# Patient Record
Sex: Female | Born: 1955 | Race: Black or African American | Hispanic: No | Marital: Single | Smoking: Former smoker
Health system: Southern US, Community
[De-identification: ages and names within clinical notes are randomized; demographics above are authoritative.]

## PROBLEM LIST (undated history)

## (undated) DIAGNOSIS — M4800 Spinal stenosis, site unspecified: Secondary | ICD-10-CM

## (undated) DIAGNOSIS — T7840XA Allergy, unspecified, initial encounter: Secondary | ICD-10-CM

## (undated) DIAGNOSIS — J449 Chronic obstructive pulmonary disease, unspecified: Secondary | ICD-10-CM

## (undated) DIAGNOSIS — N289 Disorder of kidney and ureter, unspecified: Secondary | ICD-10-CM

## (undated) DIAGNOSIS — N189 Chronic kidney disease, unspecified: Secondary | ICD-10-CM

## (undated) DIAGNOSIS — E785 Hyperlipidemia, unspecified: Secondary | ICD-10-CM

## (undated) DIAGNOSIS — K219 Gastro-esophageal reflux disease without esophagitis: Secondary | ICD-10-CM

## (undated) DIAGNOSIS — E119 Type 2 diabetes mellitus without complications: Secondary | ICD-10-CM

## (undated) DIAGNOSIS — D649 Anemia, unspecified: Secondary | ICD-10-CM

## (undated) DIAGNOSIS — M1990 Unspecified osteoarthritis, unspecified site: Secondary | ICD-10-CM

## (undated) DIAGNOSIS — G4730 Sleep apnea, unspecified: Secondary | ICD-10-CM

## (undated) DIAGNOSIS — I10 Essential (primary) hypertension: Secondary | ICD-10-CM

## (undated) DIAGNOSIS — M25559 Pain in unspecified hip: Secondary | ICD-10-CM

## (undated) HISTORY — DX: Essential (primary) hypertension: I10

## (undated) HISTORY — DX: Allergy, unspecified, initial encounter: T78.40XA

## (undated) HISTORY — PX: CHOLECYSTECTOMY: SHX55

## (undated) HISTORY — PX: ABDOMINAL HYSTERECTOMY: SHX81

## (undated) HISTORY — DX: Chronic obstructive pulmonary disease, unspecified: J44.9

## (undated) HISTORY — DX: Unspecified osteoarthritis, unspecified site: M19.90

## (undated) HISTORY — DX: Type 2 diabetes mellitus without complications: E11.9

## (undated) HISTORY — DX: Pain in unspecified hip: M25.559

## (undated) HISTORY — DX: Gastro-esophageal reflux disease without esophagitis: K21.9

## (undated) HISTORY — DX: Hyperlipidemia, unspecified: E78.5

## (undated) HISTORY — PX: HIP SURGERY: SHX245

---

## 2012-10-22 HISTORY — PX: COLONOSCOPY: SHX174

## 2012-10-22 DEATH — deceased

## 2014-05-10 DIAGNOSIS — Z79899 Other long term (current) drug therapy: Secondary | ICD-10-CM | POA: Diagnosis not present

## 2014-05-10 DIAGNOSIS — M549 Dorsalgia, unspecified: Secondary | ICD-10-CM | POA: Diagnosis not present

## 2014-05-10 DIAGNOSIS — Z5181 Encounter for therapeutic drug level monitoring: Secondary | ICD-10-CM | POA: Diagnosis not present

## 2014-08-06 DIAGNOSIS — Z79899 Other long term (current) drug therapy: Secondary | ICD-10-CM | POA: Diagnosis not present

## 2014-08-06 DIAGNOSIS — Z5181 Encounter for therapeutic drug level monitoring: Secondary | ICD-10-CM | POA: Diagnosis not present

## 2014-08-06 DIAGNOSIS — M549 Dorsalgia, unspecified: Secondary | ICD-10-CM | POA: Diagnosis not present

## 2014-08-06 DIAGNOSIS — F419 Anxiety disorder, unspecified: Secondary | ICD-10-CM | POA: Diagnosis not present

## 2014-08-06 DIAGNOSIS — Z79891 Long term (current) use of opiate analgesic: Secondary | ICD-10-CM | POA: Diagnosis not present

## 2014-10-19 DIAGNOSIS — M19031 Primary osteoarthritis, right wrist: Secondary | ICD-10-CM | POA: Diagnosis not present

## 2014-10-26 DIAGNOSIS — N189 Chronic kidney disease, unspecified: Secondary | ICD-10-CM | POA: Diagnosis not present

## 2014-10-26 DIAGNOSIS — Z0000 Encounter for general adult medical examination without abnormal findings: Secondary | ICD-10-CM | POA: Diagnosis not present

## 2014-10-26 DIAGNOSIS — J069 Acute upper respiratory infection, unspecified: Secondary | ICD-10-CM | POA: Diagnosis not present

## 2014-10-26 DIAGNOSIS — G4700 Insomnia, unspecified: Secondary | ICD-10-CM | POA: Diagnosis not present

## 2014-10-26 DIAGNOSIS — E119 Type 2 diabetes mellitus without complications: Secondary | ICD-10-CM | POA: Diagnosis not present

## 2014-10-26 DIAGNOSIS — L409 Psoriasis, unspecified: Secondary | ICD-10-CM | POA: Diagnosis not present

## 2014-10-26 DIAGNOSIS — M129 Arthropathy, unspecified: Secondary | ICD-10-CM | POA: Diagnosis not present

## 2014-10-26 DIAGNOSIS — F17210 Nicotine dependence, cigarettes, uncomplicated: Secondary | ICD-10-CM | POA: Diagnosis not present

## 2014-12-13 DIAGNOSIS — M545 Low back pain: Secondary | ICD-10-CM | POA: Diagnosis not present

## 2014-12-13 DIAGNOSIS — E119 Type 2 diabetes mellitus without complications: Secondary | ICD-10-CM | POA: Diagnosis not present

## 2014-12-13 DIAGNOSIS — G589 Mononeuropathy, unspecified: Secondary | ICD-10-CM | POA: Diagnosis not present

## 2014-12-13 DIAGNOSIS — I10 Essential (primary) hypertension: Secondary | ICD-10-CM | POA: Diagnosis not present

## 2014-12-13 DIAGNOSIS — M129 Arthropathy, unspecified: Secondary | ICD-10-CM | POA: Diagnosis not present

## 2014-12-13 DIAGNOSIS — F419 Anxiety disorder, unspecified: Secondary | ICD-10-CM | POA: Diagnosis not present

## 2015-01-11 DIAGNOSIS — E119 Type 2 diabetes mellitus without complications: Secondary | ICD-10-CM | POA: Diagnosis not present

## 2015-01-11 DIAGNOSIS — M129 Arthropathy, unspecified: Secondary | ICD-10-CM | POA: Diagnosis not present

## 2015-01-11 DIAGNOSIS — I10 Essential (primary) hypertension: Secondary | ICD-10-CM | POA: Diagnosis not present

## 2015-01-11 DIAGNOSIS — N189 Chronic kidney disease, unspecified: Secondary | ICD-10-CM | POA: Diagnosis not present

## 2015-01-11 DIAGNOSIS — L409 Psoriasis, unspecified: Secondary | ICD-10-CM | POA: Diagnosis not present

## 2015-01-11 DIAGNOSIS — M545 Low back pain: Secondary | ICD-10-CM | POA: Diagnosis not present

## 2015-03-08 DIAGNOSIS — Z720 Tobacco use: Secondary | ICD-10-CM | POA: Diagnosis not present

## 2015-03-08 DIAGNOSIS — J069 Acute upper respiratory infection, unspecified: Secondary | ICD-10-CM | POA: Diagnosis not present

## 2015-03-08 DIAGNOSIS — K219 Gastro-esophageal reflux disease without esophagitis: Secondary | ICD-10-CM | POA: Diagnosis not present

## 2015-03-08 DIAGNOSIS — E108 Type 1 diabetes mellitus with unspecified complications: Secondary | ICD-10-CM | POA: Diagnosis not present

## 2015-03-08 DIAGNOSIS — I10 Essential (primary) hypertension: Secondary | ICD-10-CM | POA: Diagnosis not present

## 2015-03-08 DIAGNOSIS — L408 Other psoriasis: Secondary | ICD-10-CM | POA: Diagnosis not present

## 2015-03-08 DIAGNOSIS — E785 Hyperlipidemia, unspecified: Secondary | ICD-10-CM | POA: Diagnosis not present

## 2015-03-08 DIAGNOSIS — G629 Polyneuropathy, unspecified: Secondary | ICD-10-CM | POA: Diagnosis not present

## 2015-06-28 DIAGNOSIS — D649 Anemia, unspecified: Secondary | ICD-10-CM | POA: Diagnosis not present

## 2015-06-28 DIAGNOSIS — Z0000 Encounter for general adult medical examination without abnormal findings: Secondary | ICD-10-CM | POA: Diagnosis not present

## 2015-06-28 DIAGNOSIS — E119 Type 2 diabetes mellitus without complications: Secondary | ICD-10-CM | POA: Diagnosis not present

## 2015-09-28 ENCOUNTER — Ambulatory Visit (INDEPENDENT_AMBULATORY_CARE_PROVIDER_SITE_OTHER): Payer: Medicare (Managed Care) | Admitting: Family Medicine

## 2015-09-28 ENCOUNTER — Other Ambulatory Visit: Payer: Self-pay | Admitting: Family Medicine

## 2015-09-28 ENCOUNTER — Encounter: Payer: Self-pay | Admitting: Family Medicine

## 2015-09-28 VITALS — BP 120/80 | HR 76 | Ht 63.0 in | Wt 220.0 lb

## 2015-09-28 DIAGNOSIS — G4733 Obstructive sleep apnea (adult) (pediatric): Secondary | ICD-10-CM | POA: Insufficient documentation

## 2015-09-28 DIAGNOSIS — Z96642 Presence of left artificial hip joint: Secondary | ICD-10-CM

## 2015-09-28 DIAGNOSIS — L4050 Arthropathic psoriasis, unspecified: Secondary | ICD-10-CM

## 2015-09-28 DIAGNOSIS — E1165 Type 2 diabetes mellitus with hyperglycemia: Secondary | ICD-10-CM | POA: Diagnosis not present

## 2015-09-28 DIAGNOSIS — D509 Iron deficiency anemia, unspecified: Secondary | ICD-10-CM

## 2015-09-28 DIAGNOSIS — J40 Bronchitis, not specified as acute or chronic: Secondary | ICD-10-CM

## 2015-09-28 DIAGNOSIS — Z23 Encounter for immunization: Secondary | ICD-10-CM

## 2015-09-28 DIAGNOSIS — Z87448 Personal history of other diseases of urinary system: Secondary | ICD-10-CM

## 2015-09-28 DIAGNOSIS — E669 Obesity, unspecified: Secondary | ICD-10-CM

## 2015-09-28 DIAGNOSIS — E1140 Type 2 diabetes mellitus with diabetic neuropathy, unspecified: Secondary | ICD-10-CM | POA: Diagnosis not present

## 2015-09-28 DIAGNOSIS — K2100 Gastro-esophageal reflux disease with esophagitis, without bleeding: Secondary | ICD-10-CM

## 2015-09-28 DIAGNOSIS — E785 Hyperlipidemia, unspecified: Secondary | ICD-10-CM | POA: Diagnosis not present

## 2015-09-28 DIAGNOSIS — F17200 Nicotine dependence, unspecified, uncomplicated: Secondary | ICD-10-CM | POA: Insufficient documentation

## 2015-09-28 DIAGNOSIS — E113299 Type 2 diabetes mellitus with mild nonproliferative diabetic retinopathy without macular edema, unspecified eye: Secondary | ICD-10-CM

## 2015-09-28 DIAGNOSIS — I10 Essential (primary) hypertension: Secondary | ICD-10-CM | POA: Diagnosis not present

## 2015-09-28 DIAGNOSIS — M549 Dorsalgia, unspecified: Secondary | ICD-10-CM | POA: Diagnosis not present

## 2015-09-28 DIAGNOSIS — E559 Vitamin D deficiency, unspecified: Secondary | ICD-10-CM

## 2015-09-28 DIAGNOSIS — Z720 Tobacco use: Secondary | ICD-10-CM | POA: Diagnosis not present

## 2015-09-28 DIAGNOSIS — IMO0002 Reserved for concepts with insufficient information to code with codable children: Secondary | ICD-10-CM

## 2015-09-28 DIAGNOSIS — G8929 Other chronic pain: Secondary | ICD-10-CM

## 2015-09-28 DIAGNOSIS — N183 Chronic kidney disease, stage 3 unspecified: Secondary | ICD-10-CM

## 2015-09-28 DIAGNOSIS — J309 Allergic rhinitis, unspecified: Secondary | ICD-10-CM | POA: Diagnosis not present

## 2015-09-28 DIAGNOSIS — K219 Gastro-esophageal reflux disease without esophagitis: Secondary | ICD-10-CM | POA: Insufficient documentation

## 2015-09-28 DIAGNOSIS — R197 Diarrhea, unspecified: Secondary | ICD-10-CM

## 2015-09-28 DIAGNOSIS — M4800 Spinal stenosis, site unspecified: Secondary | ICD-10-CM | POA: Insufficient documentation

## 2015-09-28 MED ORDER — AZITHROMYCIN 250 MG PO TABS: ORAL_TABLET | ORAL | Status: DC

## 2015-09-28 NOTE — Progress Notes (Signed)
Date:  09/28/2015   Name:  Kathleen Lee   DOB:  21-Oct-1956   MRN:  [id number]  PCP:  No primary care provider on file.    Chief Complaint: Establish Care   History of Present Illness:  This is a 59 y.o. female with MMP recently relocated here from [place]. Has psoriatic arthritis on [place] for past year, helping. 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 AM BG's aroung 130. Hx HLD well controlled on Lipitor. Hx GERD with esophagitis on LT Nexium. Hx AR well controlled on Veramyst. Takes Lasix daily for HTN and LE edema. Hx ARF on dialysis in past but says kidney function now normal. S/p L THR 02-02-99. Hx spinal stenosis, has seen NSGY and ortho in past but pain getting worse, wants to see NSGY again. Hx chronic back pain, tramadol doesn't help much. Tetanus imm, mammogram, and colonoscopy last year, needs flu imm. C/o frequent diarrhea with flatus and intermittent blood in stool. Hx OSA on CPAP, machine broke during move. C/o 1 wk hx NP cough, sinus congestion, rhinorrhea. Smoker. Mother died with Alz dementia February 01, 2010.  Review of Systems:  Review of Systems  Constitutional: Negative for fever and chills.  HENT: Negative for ear pain, sore throat and trouble swallowing.   Eyes: Negative for pain.  Respiratory: Negative for shortness of breath.   Cardiovascular: Negative for chest pain.  Endocrine: Negative for polyuria.  Genitourinary: Negative for difficulty urinating.  Neurological: Negative for syncope and light-headedness.    Patient Active Problem List   Diagnosis Date Noted  . Hypertension 09/28/2015  . Type 2 diabetes, uncontrolled, with neuropathy ([place]) 09/28/2015  . Hx of acute renal failure 09/28/2015  . Hyperlipidemia 09/28/2015  . Psoriatic arthritis ([place]) 09/28/2015  . Obesity, Class II, BMI 35-39.9 09/28/2015  . Spinal stenosis 09/28/2015  . GERD (gastroesophageal reflux disease) 09/28/2015  . Allergic rhinitis  09/28/2015  . Chronic back pain 09/28/2015  . OSA (obstructive sleep apnea) 09/28/2015  . Smoker 09/28/2015  . History of left hip replacement 09/28/2015    Prior to Admission medications   Medication Sig Start Date End Date Taking? Authorizing Provider  Apremilast (OTEZLA) 30 MG TABS Take 1 tablet by mouth 2 (two) times daily.   Yes Historical Provider, MD  atorvastatin (LIPITOR) 40 MG tablet Take 40 mg by mouth every evening. for cholesterol 09/21/15  Yes Historical Provider, MD  azithromycin (ZITHROMAX) 250 MG tablet Take two tablets today then one tablet daily for four days 09/28/15   [name], MD  esomeprazole (NEXIUM) 20 MG capsule Take 20 mg by mouth daily at 12 noon.   Yes Historical Provider, MD  fluticasone (VERAMYST) 27.5 MCG/SPRAY nasal spray Place 2 sprays into the nose daily.   Yes Historical Provider, MD  furosemide (LASIX) 40 MG tablet Take 40 mg by mouth daily.   Yes Historical Provider, MD  insulin aspart (NOVOLOG FLEXPEN) 100 UNIT/ML FlexPen Inject 60 Units into the skin 3 (three) times daily with meals.   Yes Historical Provider, MD  insulin glargine (LANTUS) 100 UNIT/ML injection Inject 25 Units into the skin at bedtime.   Yes Historical Provider, MD  leflunomide (ARAVA) 10 MG tablet Take 10 mg by mouth daily.   Yes Historical Provider, MD  lisinopril (PRINIVIL,ZESTRIL) 20 MG tablet Take 20 mg by mouth daily.   Yes Historical Provider, MD  Potassium Chloride ER 20 MEQ TBCR Take 1 tablet by mouth daily.   Yes  Historical Provider, MD  pregabalin (LYRICA) 100 MG capsule Take 100 mg by mouth 3 (three) times daily.   Yes Historical Provider, MD  traMADol (ULTRAM) 50 MG tablet Take 50 mg by mouth daily.   Yes Historical Provider, MD    No Known Allergies  Past Surgical History  Procedure Laterality Date  . Hip surgery Left   . Cholecystectomy    . Colonoscopy  2014    inconclusive    Social History  Substance Use Topics  . Smoking status: Current Every Day  Smoker  . Smokeless tobacco: None  . Alcohol Use: No    History reviewed. No pertinent family history.  Medication list has been reviewed and updated.  Physical Examination: BP 120/80 mmHg  Pulse 76  Ht 5\' 3"  (1.6 m)  Wt 220 lb (99.791 kg)  BMI 38.98 kg/m2  Physical Exam  Constitutional: She is oriented to person, place, and time. She appears well-developed and well-nourished.  HENT:  Head: Normocephalic and atraumatic.  Right Ear: External ear normal.  Left Ear: External ear normal.  Nose: Nose normal.  Mouth/Throat: Oropharynx is clear and moist.  TM's clear  Neck: Neck supple. No thyromegaly present.  Cardiovascular: Normal rate, regular rhythm and normal heart sounds.   Pulmonary/Chest: Effort normal and breath sounds normal.  Abdominal: Soft. She exhibits no distension and no mass. There is no tenderness.  Musculoskeletal: She exhibits no edema.  Lymphadenopathy:    She has no cervical adenopathy.  Neurological: She is alert and oriented to person, place, and time. Coordination normal.  Romberg negative but gait wide-based  Skin: Skin is warm and dry.  Psychiatric: She has a normal mood and affect. Her behavior is normal.  Nursing note and vitals reviewed.   Assessment and Plan:  1. Psoriatic arthritis ([place]) Well controlled on [place] - Ambulatory referral to Dermatology - Ambulatory referral to Rheumatology  2. Essential hypertension Well controlled on lisinopril/Lasix - Comprehensive Metabolic Panel (CMET) - CBC  3. Type 2 diabetes, uncontrolled, with neuropathy (HCC) Unclear control on Lantus/Novolog/Lyrica - HgB A1c - B12 - Urine Microalbumin w/creat. ratio - Ambulatory referral to Ophthalmology - Ambulatory referral to Podiatry  4. Hyperlipidemia Well controlled on Lipitor - Lipid Profile  5. Obesity, Class II, BMI 35-39.9 Discussed exercise/weight loss - TSH - Vitamin D (25 hydroxy)  6. Bronchitis Persistent, Zpak x 5d  7.  Spinal stenosis, unspecified spinal region Pain progressive, requests referral back to [place] - Ambulatory referral to Neurosurgery  8. Gastroesophageal reflux disease with esophagitis Well controlled on long-term Nexium  9. Allergic rhinitis, unspecified allergic rhinitis type Well controlled on Veramyst  10. Chronic back pain Marginal control on tramadol  11. OSA (obstructive sleep apnea) On CPAP, needs new machine - Ambulatory referral to Sleep Studies  12. Smoker Not interested in cessation  13. Hx of acute renal failure Check renal function today  14. Need for influenza vaccination Consider pneumovax imm next visit - Flu Vaccine QUAD 36+ mos PF IM (Fluarix & Fluzone Quad PF)  15. History of left hip replacement  16. Diarrhea, unspecified type With intermittent hematochezia, refer GI - Ambulatory referral to Gastroenterology  No Follow-up on file.  Satira Anis. [facility]  09/28/2015

## 2015-09-29 DIAGNOSIS — N183 Chronic kidney disease, stage 3 (moderate): Secondary | ICD-10-CM

## 2015-09-29 DIAGNOSIS — E559 Vitamin D deficiency, unspecified: Secondary | ICD-10-CM | POA: Insufficient documentation

## 2015-09-29 DIAGNOSIS — D631 Anemia in chronic kidney disease: Secondary | ICD-10-CM | POA: Insufficient documentation

## 2015-09-29 DIAGNOSIS — D649 Anemia, unspecified: Secondary | ICD-10-CM | POA: Insufficient documentation

## 2015-09-29 LAB — COMPREHENSIVE METABOLIC PANEL
A/G RATIO: 1.3 (ref 1.1–2.5)
ALBUMIN: 3.8 g/dL (ref 3.5–5.5)
ALT: 11 IU/L (ref 0–32)
AST: 14 IU/L (ref 0–40)
Alkaline Phosphatase: 113 IU/L (ref 39–117)
BUN / CREAT RATIO: 12 (ref 9–23)
BUN: 15 mg/dL (ref 6–24)
CHLORIDE: 100 mmol/L (ref 97–106)
CO2: 27 mmol/L (ref 18–29)
Calcium: 8.3 mg/dL — ABNORMAL LOW (ref 8.7–10.2)
Creatinine, Ser: 1.27 mg/dL — ABNORMAL HIGH (ref 0.57–1.00)
GFR calc non Af Amer: 47 mL/min/{1.73_m2} — ABNORMAL LOW (ref >59)
GFR, EST AFRICAN AMERICAN: 54 mL/min/{1.73_m2} — AB (ref >59)
Globulin, Total: 2.9 g/dL (ref 1.5–4.5)
Glucose: 101 mg/dL — ABNORMAL HIGH (ref 65–99)
POTASSIUM: 4.7 mmol/L (ref 3.5–5.2)
Sodium: 139 mmol/L (ref 136–144)
TOTAL PROTEIN: 6.7 g/dL (ref 6.0–8.5)

## 2015-09-29 LAB — CBC
Hematocrit: 26.1 % — ABNORMAL LOW (ref 34.0–46.6)
Hemoglobin: 7.5 g/dL — ABNORMAL LOW (ref 11.1–15.9)
MCH: 20.5 pg — ABNORMAL LOW (ref 26.6–33.0)
MCHC: 28.7 g/dL — AB (ref 31.5–35.7)
MCV: 72 fL — ABNORMAL LOW (ref 79–97)
PLATELETS: 381 10*3/uL — AB (ref 150–379)
RBC: 3.65 x10E6/uL — ABNORMAL LOW (ref 3.77–5.28)
RDW: 18.4 % — AB (ref 12.3–15.4)
WBC: 10 10*3/uL (ref 3.4–10.8)

## 2015-09-29 LAB — LIPID PANEL
CHOL/HDL RATIO: 2.7 ratio (ref 0.0–4.4)
Cholesterol, Total: 138 mg/dL (ref 100–199)
HDL: 52 mg/dL (ref >39)
LDL Calculated: 72 mg/dL (ref 0–99)
Triglycerides: 71 mg/dL (ref 0–149)
VLDL Cholesterol Cal: 14 mg/dL (ref 5–40)

## 2015-09-29 LAB — HEMOGLOBIN A1C
Est. average glucose Bld gHb Est-mCnc: 189 mg/dL
HEMOGLOBIN A1C: 8.2 % — AB (ref 4.8–5.6)

## 2015-09-29 LAB — MICROALBUMIN / CREATININE URINE RATIO
Creatinine, Urine: 44.8 mg/dL
MICROALB/CREAT RATIO: 797.1 mg/g{creat} — AB (ref 0.0–30.0)
Microalbumin, Urine: 357.1 ug/mL

## 2015-09-29 LAB — VITAMIN D 25 HYDROXY (VIT D DEFICIENCY, FRACTURES): VIT D 25 HYDROXY: 14.1 ng/mL — AB (ref 30.0–100.0)

## 2015-09-29 LAB — TSH: TSH: 1.21 u[IU]/mL (ref 0.450–4.500)

## 2015-09-29 LAB — VITAMIN B12: VITAMIN B 12: 616 pg/mL (ref 211–946)

## 2015-09-29 MED ORDER — METFORMIN HCL 500 MG PO TABS: 500.0000 mg | ORAL_TABLET | Freq: Two times a day (BID) | ORAL | Status: DC

## 2015-09-29 NOTE — Addendum Note (Signed)
Addended by: [name] on: 09/29/2015 10:22 AM   Modules accepted: Orders

## 2015-10-13 ENCOUNTER — Other Ambulatory Visit: Payer: Self-pay | Admitting: Family Medicine

## 2015-10-14 ENCOUNTER — Telehealth: Payer: Self-pay

## 2015-10-14 MED ORDER — LISINOPRIL 20 MG PO TABS: 20.0000 mg | ORAL_TABLET | Freq: Every day | ORAL | Status: DC

## 2015-10-14 NOTE — Addendum Note (Signed)
Addended by: [name] on: 10/14/2015 12:19 PM   Modules accepted: Orders

## 2015-10-14 NOTE — Telephone Encounter (Signed)
Sent to [place] 

## 2015-10-18 ENCOUNTER — Other Ambulatory Visit: Payer: Self-pay | Admitting: Family Medicine

## 2015-10-18 ENCOUNTER — Telehealth: Payer: Self-pay

## 2015-10-18 MED ORDER — LISINOPRIL 40 MG PO TABS: 40.0000 mg | ORAL_TABLET | Freq: Every day | ORAL | Status: DC

## 2015-10-18 NOTE — Telephone Encounter (Signed)
Sent to [place] 

## 2015-10-31 ENCOUNTER — Ambulatory Visit: Payer: Medicare (Managed Care) | Admitting: Family Medicine

## 2015-11-01 ENCOUNTER — Ambulatory Visit: Payer: Medicare (Managed Care) | Admitting: Family Medicine

## 2015-11-02 ENCOUNTER — Other Ambulatory Visit: Payer: Self-pay

## 2015-11-03 ENCOUNTER — Encounter (INDEPENDENT_AMBULATORY_CARE_PROVIDER_SITE_OTHER): Payer: Self-pay

## 2015-11-03 ENCOUNTER — Encounter: Payer: Self-pay | Admitting: Gastroenterology

## 2015-11-03 ENCOUNTER — Other Ambulatory Visit: Payer: Self-pay

## 2015-11-03 ENCOUNTER — Ambulatory Visit (INDEPENDENT_AMBULATORY_CARE_PROVIDER_SITE_OTHER): Payer: Medicare Other | Admitting: Gastroenterology

## 2015-11-03 ENCOUNTER — Ambulatory Visit: Payer: Medicare (Managed Care) | Admitting: Gastroenterology

## 2015-11-03 VITALS — BP 129/65 | HR 94 | Temp 99.3°F | Ht 63.0 in | Wt 220.0 lb

## 2015-11-03 DIAGNOSIS — R197 Diarrhea, unspecified: Secondary | ICD-10-CM

## 2015-11-03 DIAGNOSIS — D509 Iron deficiency anemia, unspecified: Secondary | ICD-10-CM | POA: Diagnosis not present

## 2015-11-03 NOTE — Progress Notes (Signed)
Gastroenterology Consultation  Referring Provider:     No ref. provider found Primary Care Physician:  [name], MD Primary Gastroenterologist:  Dr. Allen Norris     Reason for Consultation:     Diarrhea        HPI:   Kathleen Lee is a 60 y.o. y/o female referred for consultation & management of arrhythmia by Dr. [name], MD.  This patient comes in today with a history of having a colonoscopy 2 years ago that was a poor prep. The patient now has diarrhea that started shortly after having the colonoscopy but she does not think is related. The patient has 5 bowel movements of watery diarrhea per day. She has no weight loss fevers chills nausea vomiting. The patient also states that she has woken up in the middle of night from her diarrhea. The patient denies any unexplained weight loss. The patient recently had blood work that showed her hemoglobin to be 7.5. The patient also was found to have an MCV that was low at 72. She had B12 that was normal. The patient has also had chronic renal insufficiency.  Past Medical History  Diagnosis Date  . Diabetes mellitus without complication ([place])   . Hyperlipidemia   . Hypertension   . Hip pain   . Arthritis   . COPD (chronic obstructive pulmonary disease) ([place])   . GERD (gastroesophageal reflux disease)     Past Surgical History  Procedure Laterality Date  . Hip surgery Left   . Cholecystectomy    . Colonoscopy  2014    inconclusive  . Abdominal hysterectomy      Prior to Admission medications   Medication Sig Start Date End Date Taking? Authorizing Provider  Apremilast (OTEZLA) 30 MG TABS Take 1 tablet by mouth 2 (two) times daily.   Yes Historical Provider, MD  atorvastatin (LIPITOR) 40 MG tablet Take 40 mg by mouth every evening. for cholesterol 09/21/15  Yes Historical Provider, MD  esomeprazole (NEXIUM) 20 MG capsule Take 20 mg by mouth daily at 12 noon.   Yes Historical Provider, MD  fluticasone (VERAMYST) 27.5 MCG/SPRAY nasal  spray Place 2 sprays into the nose daily.   Yes Historical Provider, MD  furosemide (LASIX) 40 MG tablet Take 40 mg by mouth daily.   Yes Historical Provider, MD  insulin aspart (NOVOLOG FLEXPEN) 100 UNIT/ML FlexPen Inject 60 Units into the skin 3 (three) times daily with meals.   Yes Historical Provider, MD  insulin glargine (LANTUS) 100 UNIT/ML injection Inject 25 Units into the skin at bedtime.   Yes Historical Provider, MD  lisinopril (PRINIVIL,ZESTRIL) 40 MG tablet Take 1 tablet (40 mg total) by mouth daily. 10/18/15  Yes [name], MD  metFORMIN (GLUCOPHAGE) 500 MG tablet Take 1 tablet (500 mg total) by mouth 2 (two) times daily with a meal. 09/29/15  Yes [name], MD  Potassium Chloride ER 20 MEQ TBCR Take 1 tablet by mouth daily.   Yes Historical Provider, MD  pregabalin (LYRICA) 100 MG capsule Take 100 mg by mouth 3 (three) times daily.   Yes Historical Provider, MD  Vitamin D, Ergocalciferol, (DRISDOL) 50000 units CAPS capsule Take 50,000 Units by mouth once a week. 10/20/15  Yes Historical Provider, MD  azithromycin (ZITHROMAX) 250 MG tablet Take two tablets today then one tablet daily for four days Patient not taking: Reported on 11/03/2015 09/28/15   [name], MD  leflunomide (ARAVA) 10 MG tablet Take 10 mg by mouth daily. Reported on 11/03/2015  Historical Provider, MD  traMADol (ULTRAM) 50 MG tablet Take 50 mg by mouth daily. Reported on 11/03/2015    Historical Provider, MD    Family History  Problem Relation Age of Onset  . Diabetes Father      Social History  Substance Use Topics  . Smoking status: Current Every Day Smoker  . Smokeless tobacco: Never Used  . Alcohol Use: No    Allergies as of 11/03/2015  . (No Known Allergies)    Review of Systems:    All systems reviewed and negative except where noted in HPI.   Physical Exam:  BP 129/65 mmHg  Pulse 94  Temp(Src) 99.3 F (37.4 C) (Oral)  Ht 5\' 3"  (1.6 m)  Wt 220 lb (99.791 kg)  BMI 38.98  kg/m2 No LMP recorded. Patient has had a hysterectomy. Psych:  Alert and cooperative. Normal mood and affect. General:   Alert,  Well-developed, obese well-nourished, pleasant and cooperative in NAD Head:  Normocephalic and atraumatic. Eyes:  Sclera clear, no icterus.   Conjunctiva pink. Ears:  Normal auditory acuity. Nose:  No deformity, discharge, or lesions. Mouth:  No deformity or lesions,oropharynx pink & moist. Neck:  Supple; no masses or thyromegaly. Lungs:  Respirations even and unlabored.  Clear throughout to auscultation.   No wheezes, crackles, or rhonchi. No acute distress. Heart:  Regular rate and rhythm; no murmurs, clicks, rubs, or gallops. Abdomen:  Normal bowel sounds.  No bruits.  Soft, non-tender and non-distended without masses, hepatosplenomegaly or hernias noted.  No guarding or rebound tenderness.  Negative Carnett sign.   Rectal:  Deferred.  Msk:  Symmetrical without gross deformities.  Good, abnormal gait walking with a cane. Pulses:  Normal pulses noted. Extremities:  No clubbing or edema.  No cyanosis. Neurologic:  Alert and oriented x3;  grossly normal neurologically. Skin:  Intact without significant lesions or rashes.  No jaundice. Lymph Nodes:  No significant cervical adenopathy. Psych:  Alert and cooperative. Normal mood and affect.  Imaging Studies: No results found.  Assessment and Plan:   Kathleen Lee is a 60 y.o. y/o female who comes in today with a history of anemia and diarrhea. The patient has had diarrhea since she moved here from [place]. The patient will be set up for an EGD and colonoscopy due to her diarrhea and anemia. The patient has been explained the plan and agrees with it. I have discussed risks & benefits which include, but are not limited to, bleeding, infection, perforation & drug reaction.  The patient agrees with this plan & written consent will be obtained.      Note: This dictation was prepared with Dragon dictation along with  smaller phrase technology. Any transcriptional errors that result from this process are unintentional.

## 2015-11-11 DIAGNOSIS — E113293 Type 2 diabetes mellitus with mild nonproliferative diabetic retinopathy without macular edema, bilateral: Secondary | ICD-10-CM | POA: Diagnosis not present

## 2015-11-14 DIAGNOSIS — E113299 Type 2 diabetes mellitus with mild nonproliferative diabetic retinopathy without macular edema, unspecified eye: Secondary | ICD-10-CM | POA: Insufficient documentation

## 2015-11-15 DIAGNOSIS — L851 Acquired keratosis [keratoderma] palmaris et plantaris: Secondary | ICD-10-CM | POA: Diagnosis not present

## 2015-11-15 DIAGNOSIS — Z794 Long term (current) use of insulin: Secondary | ICD-10-CM | POA: Diagnosis not present

## 2015-11-15 DIAGNOSIS — B351 Tinea unguium: Secondary | ICD-10-CM | POA: Diagnosis not present

## 2015-11-15 DIAGNOSIS — E1140 Type 2 diabetes mellitus with diabetic neuropathy, unspecified: Secondary | ICD-10-CM | POA: Diagnosis not present

## 2015-11-17 ENCOUNTER — Ambulatory Visit (INDEPENDENT_AMBULATORY_CARE_PROVIDER_SITE_OTHER): Payer: Medicare Other | Admitting: Family Medicine

## 2015-11-17 ENCOUNTER — Encounter: Payer: Self-pay | Admitting: Family Medicine

## 2015-11-17 VITALS — BP 127/78 | HR 98 | Temp 98.4°F | Resp 15 | Ht 63.0 in | Wt 224.4 lb

## 2015-11-17 DIAGNOSIS — I10 Essential (primary) hypertension: Secondary | ICD-10-CM | POA: Diagnosis not present

## 2015-11-17 DIAGNOSIS — E669 Obesity, unspecified: Secondary | ICD-10-CM

## 2015-11-17 DIAGNOSIS — Z23 Encounter for immunization: Secondary | ICD-10-CM | POA: Diagnosis not present

## 2015-11-17 DIAGNOSIS — M4800 Spinal stenosis, site unspecified: Secondary | ICD-10-CM

## 2015-11-17 DIAGNOSIS — E1140 Type 2 diabetes mellitus with diabetic neuropathy, unspecified: Secondary | ICD-10-CM

## 2015-11-17 DIAGNOSIS — E1165 Type 2 diabetes mellitus with hyperglycemia: Secondary | ICD-10-CM | POA: Diagnosis not present

## 2015-11-17 DIAGNOSIS — E559 Vitamin D deficiency, unspecified: Secondary | ICD-10-CM

## 2015-11-17 DIAGNOSIS — M25552 Pain in left hip: Secondary | ICD-10-CM

## 2015-11-17 DIAGNOSIS — N183 Chronic kidney disease, stage 3 unspecified: Secondary | ICD-10-CM

## 2015-11-17 DIAGNOSIS — D509 Iron deficiency anemia, unspecified: Secondary | ICD-10-CM | POA: Diagnosis not present

## 2015-11-17 DIAGNOSIS — E785 Hyperlipidemia, unspecified: Secondary | ICD-10-CM

## 2015-11-17 DIAGNOSIS — IMO0002 Reserved for concepts with insufficient information to code with codable children: Secondary | ICD-10-CM

## 2015-11-17 MED ORDER — TRAMADOL HCL 50 MG PO TABS: 50.0000 mg | ORAL_TABLET | Freq: Four times a day (QID) | ORAL | Status: DC | PRN

## 2015-11-17 MED ORDER — IRON 325 (65 FE) MG PO TABS: 1.0000 | ORAL_TABLET | Freq: Every day | ORAL | Status: DC

## 2015-11-17 NOTE — Patient Instructions (Signed)
Take OTC iron sulfate 325 mg daily.

## 2015-11-17 NOTE — Progress Notes (Signed)
Date:  11/17/2015   Name:  Kathleen Lee   DOB:  1956/01/30   MRN:  [id number]  PCP:  [name], MD    Chief Complaint: Hypertension and Knee Pain   History of Present Illness:  This is a 60 y.o. female with MMP for 1 month follow up from initial visit. Lisinopril increased due to elevated MCR and K+ supplement stopped. Started on metformin in addition to usual insulin. HLD well controlled on Lipitor. Resp sxs resolved on Zpak. Spinal stenosis still causing chronic back pain. Saw GI for blood in stool, for colonoscopy next week. Blood work showed probable iron def anemia, has not started iron yet because thinks too expensive. C/o increasing L hip pain, s/p THR in 2000. On weekly vit D replacement. Blood work showed CKD3. Occ BLE edema at night.  Review of Systems:  Review of Systems  Respiratory: Negative for shortness of breath.   Cardiovascular: Negative for chest pain.  Endocrine: Negative for polyuria.  Genitourinary: Negative for difficulty urinating.  Neurological: Negative for syncope, weakness and light-headedness.    Patient Active Problem List   Diagnosis Date Noted  . Diabetic retinopathy, background ([place]) 11/14/2015  . CKD (chronic kidney disease) stage 3, GFR 30-59 ml/min 09/29/2015  . Vitamin D deficiency 09/29/2015  . Anemia 09/29/2015  . Hypertension 09/28/2015  . Type 2 diabetes, uncontrolled, with neuropathy ([place]) 09/28/2015  . Hx of acute renal failure 09/28/2015  . Hyperlipidemia 09/28/2015  . Psoriatic arthritis ([place]) 09/28/2015  . Obesity, Class II, BMI 35-39.9 09/28/2015  . Spinal stenosis 09/28/2015  . GERD (gastroesophageal reflux disease) 09/28/2015  . Allergic rhinitis 09/28/2015  . Chronic back pain 09/28/2015  . OSA (obstructive sleep apnea) 09/28/2015  . Smoker 09/28/2015  . History of left hip replacement 09/28/2015    Prior to Admission medications   Medication Sig Start Date End Date Taking? Authorizing Provider  Apremilast (OTEZLA) 30  MG TABS Take 1 tablet by mouth 2 (two) times daily.   Yes Historical Provider, MD  atorvastatin (LIPITOR) 40 MG tablet Take 40 mg by mouth every evening. for cholesterol 09/21/15  Yes Historical Provider, MD  esomeprazole (NEXIUM) 20 MG capsule Take 20 mg by mouth daily at 12 noon.   Yes Historical Provider, MD  fluticasone (VERAMYST) 27.5 MCG/SPRAY nasal spray Place 2 sprays into the nose daily.   Yes Historical Provider, MD  furosemide (LASIX) 40 MG tablet Take 40 mg by mouth daily.   Yes Historical Provider, MD  insulin aspart (NOVOLOG FLEXPEN) 100 UNIT/ML FlexPen Inject 60 Units into the skin 3 (three) times daily with meals.   Yes Historical Provider, MD  insulin glargine (LANTUS) 100 UNIT/ML injection Inject 25 Units into the skin at bedtime.   Yes Historical Provider, MD  leflunomide (ARAVA) 10 MG tablet Take 10 mg by mouth daily. Reported on 11/03/2015   Yes Historical Provider, MD  lisinopril (PRINIVIL,ZESTRIL) 40 MG tablet Take 1 tablet (40 mg total) by mouth daily. 10/18/15  Yes [name], MD  metFORMIN (GLUCOPHAGE) 500 MG tablet Take 1 tablet (500 mg total) by mouth 2 (two) times daily with a meal. 09/29/15  Yes [name], MD  pregabalin (LYRICA) 100 MG capsule Take 100 mg by mouth 3 (three) times daily.   Yes Historical Provider, MD  Vitamin D, Ergocalciferol, (DRISDOL) 50000 units CAPS capsule Take 50,000 Units by mouth once a week. 10/20/15  Yes Historical Provider, MD  Ferrous Sulfate (IRON) 325 (65 Fe) MG TABS Take 1 tablet by mouth daily.  11/17/15   [name], MD  traMADol (ULTRAM) 50 MG tablet Take 1 tablet (50 mg total) by mouth every 6 (six) hours as needed. Reported on 11/17/2015 11/17/15   [name], MD    No Known Allergies  Past Surgical History  Procedure Laterality Date  . Hip surgery Left   . Cholecystectomy    . Colonoscopy  2014    inconclusive  . Abdominal hysterectomy      Social History  Substance Use Topics  . Smoking status: Former Smoker     Quit date: 10/23/2015  . Smokeless tobacco: Never Used  . Alcohol Use: No    Family History  Problem Relation Age of Onset  . Diabetes Father     Medication list has been reviewed and updated.  Physical Examination: BP 127/78 mmHg  Pulse 98  Temp(Src) 98.4 F (36.9 C)  Resp 15  Ht 5\' 3"  (1.6 m)  Wt 224 lb 6.4 oz (101.787 kg)  BMI 39.76 kg/m2  Physical Exam  Constitutional: She appears well-developed and well-nourished.  Cardiovascular: Normal rate, regular rhythm and normal heart sounds.   Pulmonary/Chest: Effort normal and breath sounds normal.  Musculoskeletal: She exhibits no edema.  Neurological: She is alert.  Skin: Skin is warm and dry.  Psychiatric: She has a normal mood and affect. Her behavior is normal.  Nursing note and vitals reviewed.   Assessment and Plan:  1. Type 2 diabetes, uncontrolled, with neuropathy (HCC) Marginal control, metformin added to usual insulin last month - HgB A1c  2. Essential hypertension Well controlled on current regimen  3. CKD (chronic kidney disease) stage 3, GFR 30-59 ml/min Check labs on increased lisinopril - Basic Metabolic Panel (BMET)  4. Hyperlipidemia Well controlled on Lipitor  5. Obesity, Class II, BMI 35-39.9  6. Spinal stenosis, unspecified spinal region  7. Vitamin D deficiency On weekly Drisdol past month - Vitamin D (25 hydroxy)  8. Iron deficiency anemia Start OTC Fe sulfate 325 mg daily, for colonoscopy next week - CBC  9. Left hip pain S/p THR 2000, tramadol prn pain - Ambulatory referral to Orthopedic Surgery  10. Need for pneumococcal vaccination - Pneumococcal polysaccharide vaccine 23-valent greater than or equal to 2yo subcutaneous/IM  Return in about 3 months (around 02/15/2016).  Satira Anis. [facility]  11/17/2015

## 2015-11-18 ENCOUNTER — Ambulatory Visit: Payer: Medicare Other | Attending: Neurology

## 2015-11-18 ENCOUNTER — Telehealth: Payer: Self-pay

## 2015-11-18 DIAGNOSIS — G4733 Obstructive sleep apnea (adult) (pediatric): Secondary | ICD-10-CM | POA: Diagnosis not present

## 2015-11-18 DIAGNOSIS — G4730 Sleep apnea, unspecified: Secondary | ICD-10-CM

## 2015-11-18 HISTORY — DX: Sleep apnea, unspecified: G47.30

## 2015-11-18 LAB — VITAMIN D 25 HYDROXY (VIT D DEFICIENCY, FRACTURES): VIT D 25 HYDROXY: 40.9 ng/mL (ref 30.0–100.0)

## 2015-11-18 LAB — BASIC METABOLIC PANEL
BUN / CREAT RATIO: 15 (ref 9–23)
BUN: 22 mg/dL (ref 6–24)
CHLORIDE: 100 mmol/L (ref 96–106)
CO2: 23 mmol/L (ref 18–29)
Calcium: 8.8 mg/dL (ref 8.7–10.2)
Creatinine, Ser: 1.42 mg/dL — ABNORMAL HIGH (ref 0.57–1.00)
GFR calc non Af Amer: 41 mL/min/{1.73_m2} — ABNORMAL LOW (ref >59)
GFR, EST AFRICAN AMERICAN: 47 mL/min/{1.73_m2} — AB (ref >59)
Glucose: 22 mg/dL — CL (ref 65–99)
Potassium: 4.5 mmol/L (ref 3.5–5.2)
Sodium: 143 mmol/L (ref 134–144)

## 2015-11-18 LAB — CBC
Hematocrit: 24.5 % — ABNORMAL LOW (ref 34.0–46.6)
Hemoglobin: 6.9 g/dL — CL (ref 11.1–15.9)
MCH: 19 pg — AB (ref 26.6–33.0)
MCHC: 28.2 g/dL — AB (ref 31.5–35.7)
MCV: 67 fL — AB (ref 79–97)
PLATELETS: 356 10*3/uL (ref 150–379)
RBC: 3.64 x10E6/uL — AB (ref 3.77–5.28)
RDW: 18.6 % — AB (ref 12.3–15.4)
WBC: 10.5 10*3/uL (ref 3.4–10.8)

## 2015-11-18 LAB — HEMOGLOBIN A1C
Est. average glucose Bld gHb Est-mCnc: 171 mg/dL
Hgb A1c MFr Bld: 7.6 % — ABNORMAL HIGH (ref 4.8–5.6)

## 2015-11-18 NOTE — Telephone Encounter (Signed)
Sent to [place] 

## 2015-11-18 NOTE — Telephone Encounter (Signed)
Attempted to call. Requested my account number which I do not know. Issue is glucose of 22 which is clinically inconsistent. Will address with rest of labs.

## 2015-11-21 ENCOUNTER — Encounter: Payer: Self-pay | Admitting: *Deleted

## 2015-11-21 DIAGNOSIS — G8929 Other chronic pain: Secondary | ICD-10-CM | POA: Diagnosis not present

## 2015-11-21 DIAGNOSIS — M545 Low back pain: Secondary | ICD-10-CM | POA: Diagnosis not present

## 2015-11-22 ENCOUNTER — Other Ambulatory Visit: Payer: Self-pay | Admitting: Family Medicine

## 2015-11-23 DIAGNOSIS — G4733 Obstructive sleep apnea (adult) (pediatric): Secondary | ICD-10-CM | POA: Diagnosis not present

## 2015-11-24 NOTE — Telephone Encounter (Signed)
Please review

## 2015-11-24 NOTE — Telephone Encounter (Signed)
Pataday not on our med list and needs to get eye rx refills from optho.

## 2015-11-24 NOTE — Discharge Instructions (Signed)

## 2015-11-24 NOTE — Telephone Encounter (Signed)
Pt said she will call eye Dr for Pataday drops but still needs her Flex pin needles for Novolog sent in

## 2015-11-25 ENCOUNTER — Ambulatory Visit: Payer: Medicare Other | Admitting: Anesthesiology

## 2015-11-25 ENCOUNTER — Ambulatory Visit
Admission: RE | Admit: 2015-11-25 | Discharge: 2015-11-25 | Disposition: A | Discharge status: Home / Self Care | Payer: Medicare Other | Source: Ambulatory Visit | Attending: Gastroenterology | Admitting: Gastroenterology

## 2015-11-25 ENCOUNTER — Encounter
Admission: RE | Disposition: A | Discharge status: Home / Self Care | Payer: Self-pay | Source: Ambulatory Visit | Attending: Gastroenterology

## 2015-11-25 ENCOUNTER — Other Ambulatory Visit: Payer: Self-pay | Admitting: Gastroenterology

## 2015-11-25 DIAGNOSIS — D1779 Benign lipomatous neoplasm of other sites: Secondary | ICD-10-CM | POA: Diagnosis not present

## 2015-11-25 DIAGNOSIS — R197 Diarrhea, unspecified: Secondary | ICD-10-CM | POA: Diagnosis not present

## 2015-11-25 DIAGNOSIS — R0902 Hypoxemia: Secondary | ICD-10-CM | POA: Insufficient documentation

## 2015-11-25 DIAGNOSIS — K641 Second degree hemorrhoids: Secondary | ICD-10-CM | POA: Insufficient documentation

## 2015-11-25 DIAGNOSIS — D490 Neoplasm of unspecified behavior of digestive system: Secondary | ICD-10-CM

## 2015-11-25 DIAGNOSIS — I129 Hypertensive chronic kidney disease with stage 1 through stage 4 chronic kidney disease, or unspecified chronic kidney disease: Secondary | ICD-10-CM | POA: Diagnosis not present

## 2015-11-25 DIAGNOSIS — G629 Polyneuropathy, unspecified: Secondary | ICD-10-CM | POA: Insufficient documentation

## 2015-11-25 DIAGNOSIS — K6389 Other specified diseases of intestine: Secondary | ICD-10-CM | POA: Diagnosis not present

## 2015-11-25 DIAGNOSIS — E1122 Type 2 diabetes mellitus with diabetic chronic kidney disease: Secondary | ICD-10-CM | POA: Diagnosis not present

## 2015-11-25 DIAGNOSIS — L4050 Arthropathic psoriasis, unspecified: Secondary | ICD-10-CM | POA: Diagnosis not present

## 2015-11-25 DIAGNOSIS — G709 Myoneural disorder, unspecified: Secondary | ICD-10-CM | POA: Diagnosis not present

## 2015-11-25 DIAGNOSIS — N183 Chronic kidney disease, stage 3 (moderate): Secondary | ICD-10-CM | POA: Insufficient documentation

## 2015-11-25 DIAGNOSIS — K219 Gastro-esophageal reflux disease without esophagitis: Secondary | ICD-10-CM | POA: Diagnosis not present

## 2015-11-25 DIAGNOSIS — Z794 Long term (current) use of insulin: Secondary | ICD-10-CM | POA: Diagnosis not present

## 2015-11-25 DIAGNOSIS — D509 Iron deficiency anemia, unspecified: Secondary | ICD-10-CM | POA: Diagnosis not present

## 2015-11-25 DIAGNOSIS — E785 Hyperlipidemia, unspecified: Secondary | ICD-10-CM | POA: Diagnosis not present

## 2015-11-25 DIAGNOSIS — Z79899 Other long term (current) drug therapy: Secondary | ICD-10-CM | POA: Insufficient documentation

## 2015-11-25 DIAGNOSIS — G4730 Sleep apnea, unspecified: Secondary | ICD-10-CM | POA: Insufficient documentation

## 2015-11-25 DIAGNOSIS — K529 Noninfective gastroenteritis and colitis, unspecified: Secondary | ICD-10-CM | POA: Diagnosis not present

## 2015-11-25 DIAGNOSIS — Z87891 Personal history of nicotine dependence: Secondary | ICD-10-CM | POA: Diagnosis not present

## 2015-11-25 DIAGNOSIS — J449 Chronic obstructive pulmonary disease, unspecified: Secondary | ICD-10-CM | POA: Diagnosis not present

## 2015-11-25 DIAGNOSIS — K5730 Diverticulosis of large intestine without perforation or abscess without bleeding: Secondary | ICD-10-CM | POA: Insufficient documentation

## 2015-11-25 DIAGNOSIS — Z7984 Long term (current) use of oral hypoglycemic drugs: Secondary | ICD-10-CM | POA: Diagnosis not present

## 2015-11-25 DIAGNOSIS — K2970 Gastritis, unspecified, without bleeding: Secondary | ICD-10-CM | POA: Diagnosis not present

## 2015-11-25 DIAGNOSIS — D508 Other iron deficiency anemias: Secondary | ICD-10-CM | POA: Insufficient documentation

## 2015-11-25 DIAGNOSIS — K2950 Unspecified chronic gastritis without bleeding: Secondary | ICD-10-CM | POA: Diagnosis not present

## 2015-11-25 HISTORY — PX: COLONOSCOPY WITH PROPOFOL: SHX5780

## 2015-11-25 HISTORY — DX: Sleep apnea, unspecified: G47.30

## 2015-11-25 HISTORY — DX: Anemia, unspecified: D64.9

## 2015-11-25 HISTORY — PX: ESOPHAGOGASTRODUODENOSCOPY (EGD) WITH PROPOFOL: SHX5813

## 2015-11-25 HISTORY — DX: Spinal stenosis, site unspecified: M48.00

## 2015-11-25 HISTORY — DX: Chronic kidney disease, unspecified: N18.9

## 2015-11-25 LAB — GLUCOSE, CAPILLARY
GLUCOSE-CAPILLARY: 97 mg/dL (ref 65–99)
GLUCOSE-CAPILLARY: 142 mg/dL — AB (ref 65–99)
Glucose-Capillary: 60 mg/dL — ABNORMAL LOW (ref 65–99)

## 2015-11-25 SURGERY — COLONOSCOPY WITH PROPOFOL
Anesthesia: Monitor Anesthesia Care | Wound class: Contaminated

## 2015-11-25 MED ORDER — GLYCOPYRROLATE 0.2 MG/ML IJ SOLN
INTRAMUSCULAR | Status: DC | PRN
  Administered 2015-11-25: 0.1 mg via INTRAVENOUS

## 2015-11-25 MED ORDER — PHENYLEPHRINE HCL 10 MG/ML IJ SOLN
INTRAMUSCULAR | Status: DC | PRN
  Administered 2015-11-25: 100 ug via INTRAVENOUS
  Administered 2015-11-25 (×3): 50 ug via INTRAVENOUS

## 2015-11-25 MED ORDER — ONDANSETRON HCL 4 MG/2ML IJ SOLN: 4.0000 mg | Freq: Once | INTRAMUSCULAR | Status: DC | PRN

## 2015-11-25 MED ORDER — DEXTROSE 50 % IV SOLN
12.5000 g | Freq: Once | INTRAVENOUS | Status: AC
  Administered 2015-11-25: 1 via INTRAVENOUS

## 2015-11-25 MED ORDER — LACTATED RINGERS IV SOLN
INTRAVENOUS | Status: DC
  Administered 2015-11-25: 10:00:00 via INTRAVENOUS

## 2015-11-25 MED ORDER — STERILE WATER FOR IRRIGATION IR SOLN
Status: DC | PRN
  Administered 2015-11-25: 11:00:00

## 2015-11-25 MED ORDER — ACETAMINOPHEN 160 MG/5ML PO SOLN: 325.0000 mg | ORAL | Status: DC | PRN

## 2015-11-25 MED ORDER — ACETAMINOPHEN 325 MG PO TABS: 325.0000 mg | ORAL_TABLET | ORAL | Status: DC | PRN

## 2015-11-25 MED ORDER — LIDOCAINE HCL (CARDIAC) 20 MG/ML IV SOLN
INTRAVENOUS | Status: DC | PRN
  Administered 2015-11-25: 50 mg via INTRAVENOUS

## 2015-11-25 MED ORDER — DEXTROSE 50 % IV SOLN
25.0000 g | Freq: Once | INTRAVENOUS | Status: AC
  Administered 2015-11-25: 25 g via INTRAVENOUS

## 2015-11-25 MED ORDER — PROPOFOL 10 MG/ML IV BOLUS
INTRAVENOUS | Status: DC | PRN
  Administered 2015-11-25: 100 mg via INTRAVENOUS
  Administered 2015-11-25: 50 mg via INTRAVENOUS
  Administered 2015-11-25 (×2): 20 mg via INTRAVENOUS
  Administered 2015-11-25: 30 mg via INTRAVENOUS

## 2015-11-25 SURGICAL SUPPLY — 39 items

## 2015-11-25 NOTE — Anesthesia Preprocedure Evaluation (Addendum)
Anesthesia Evaluation  Patient identified by MRN, date of birth, ID band Patient awake    Reviewed: Allergy & Precautions, NPO status , Patient's Chart, lab work & pertinent test results, reviewed documented beta blocker date and time   Airway Mallampati: III  TM Distance: >3 FB Neck ROM: Full    Dental no notable dental hx.    Pulmonary sleep apnea , COPD, former smoker,    Pulmonary exam normal        Cardiovascular hypertension, Normal cardiovascular exam     Neuro/Psych  Neuromuscular disease (peripheral neuropathy) negative psych ROS   GI/Hepatic GERD  Medicated and Controlled,  Endo/Other  diabetes, Type 2, Oral Hypoglycemic Agents  Renal/GU CRFRenal disease     Musculoskeletal  (+) Arthritis , Osteoarthritis,    Abdominal   Peds  Hematology   Anesthesia Other Findings   Reproductive/Obstetrics                            Anesthesia Physical Anesthesia Plan  ASA: III  Anesthesia Plan: MAC   Post-op Pain Management:    Induction: Intravenous  Airway Management Planned:   Additional Equipment:   Intra-op Plan:   Post-operative Plan:   Informed Consent: I have reviewed the patients History and Physical, chart, labs and discussed the procedure including the risks, benefits and alternatives for the proposed anesthesia with the patient or authorized representative who has indicated his/her understanding and acceptance.     Plan Discussed with: CRNA  Anesthesia Plan Comments:         Anesthesia Quick Evaluation

## 2015-11-25 NOTE — H&P (Signed)
[facility]  [address], [place], [place] [address] Phone: [phone] Fax : [phone]  Primary Care Physician:  [name], MD Primary Gastroenterologist:  Dr. Allen Norris  Pre-Procedure History & Physical: HPI:  Kathleen Lee is a 60 y.o. female is here for an endoscopy and colonoscopy.   Past Medical History  Diagnosis Date  . Diabetes mellitus without complication ([place])   . Hyperlipidemia   . Hypertension   . Hip pain   . COPD (chronic obstructive pulmonary disease) ([place])   . GERD (gastroesophageal reflux disease)   . Arthritis     psoriatic  . Anemia   . Sleep apnea 11/18/15    new Dx  . Spinal stenosis     lower back  . Chronic kidney disease     stage 3    Past Surgical History  Procedure Laterality Date  . Hip surgery Left   . Cholecystectomy    . Colonoscopy  2014    inconclusive  . Abdominal hysterectomy      Prior to Admission medications   Medication Sig Start Date End Date Taking? Authorizing Provider  albuterol (PROVENTIL HFA;VENTOLIN HFA) 108 (90 Base) MCG/ACT inhaler Inhale into the lungs every 6 (six) hours as needed for wheezing or shortness of breath.   Yes Historical Provider, MD  Apremilast (OTEZLA) 30 MG TABS Take 1 tablet by mouth 2 (two) times daily.   Yes Historical Provider, MD  atorvastatin (LIPITOR) 40 MG tablet Take 40 mg by mouth every evening. for cholesterol 09/21/15  Yes Historical Provider, MD  Ferrous Sulfate (IRON) 325 (65 Fe) MG TABS Take 1 tablet by mouth daily. 11/17/15  Yes [name], MD  fluticasone (VERAMYST) 27.5 MCG/SPRAY nasal spray Place 2 sprays into the nose daily.   Yes Historical Provider, MD  furosemide (LASIX) 40 MG tablet Take 40 mg by mouth daily.   Yes Historical Provider, MD  insulin aspart (NOVOLOG FLEXPEN) 100 UNIT/ML FlexPen Inject 60 Units into the skin 3 (three) times daily with meals.   Yes Historical Provider, MD  insulin glargine (LANTUS) 100 UNIT/ML injection Inject 25 Units into  the skin at bedtime.   Yes Historical Provider, MD  lisinopril (PRINIVIL,ZESTRIL) 40 MG tablet Take 1 tablet (40 mg total) by mouth daily. 10/18/15  Yes [name], MD  metFORMIN (GLUCOPHAGE) 500 MG tablet Take 1 tablet (500 mg total) by mouth 2 (two) times daily with a meal. 09/29/15  Yes [name], MD  pregabalin (LYRICA) 100 MG capsule Take 100 mg by mouth 3 (three) times daily.   Yes Historical Provider, MD  traMADol (ULTRAM) 50 MG tablet Take 1 tablet (50 mg total) by mouth every 6 (six) hours as needed. Reported on 11/17/2015 11/17/15  Yes [name], MD  Vitamin D, Ergocalciferol, (DRISDOL) 50000 units CAPS capsule Take 50,000 Units by mouth once a week. 10/20/15  Yes Historical Provider, MD  esomeprazole (NEXIUM) 20 MG capsule Take 20 mg by mouth daily at 12 noon.    Historical Provider, MD  leflunomide (ARAVA) 10 MG tablet Take 10 mg by mouth daily. Reported on 11/03/2015    Historical Provider, MD    Allergies as of 11/03/2015  . (No Known Allergies)    Family History  Problem Relation Age of Onset  . Diabetes Father     Social History   Social History  . Marital Status: Single    Spouse Name: N/A  . Number of Children: N/A  . Years of Education: N/A   Occupational  History  . Not on file.   Social History Main Topics  . Smoking status: Former Smoker    Quit date: 10/23/2015  . Smokeless tobacco: Never Used  . Alcohol Use: No  . Drug Use: No  . Sexual Activity: Not Currently   Other Topics Concern  . Not on file   Social History Narrative    Review of Systems: See HPI, otherwise negative ROS  Physical Exam: BP 131/72 mmHg  Pulse 84  Temp(Src) 97.9 F (36.6 C)  Resp 16  Ht 5\' 3"  (1.6 m)  Wt 214 lb (97.07 kg)  BMI 37.92 kg/m2  SpO2 95% General:   Alert,  pleasant and cooperative in NAD Head:  Normocephalic and atraumatic. Neck:  Supple; no masses or thyromegaly. Lungs:  Clear throughout to auscultation.    Heart:  Regular rate and  rhythm. Abdomen:  Soft, nontender and nondistended. Normal bowel sounds, without guarding, and without rebound.   Neurologic:  Alert and  oriented x4;  grossly normal neurologically.  Impression/Plan: [name] is here for an endoscopy and colonoscopy to be performed for diarrhea and anemia  Risks, benefits, limitations, and alternatives regarding  endoscopy and colonoscopy have been reviewed with the patient.  Questions have been answered.  All parties agreeable.   Ollen Bowl, MD  11/25/2015, 10:03 AM

## 2015-11-25 NOTE — Anesthesia Procedure Notes (Signed)
Procedure Name: MAC Date/Time: 11/25/2015 10:34 AM Performed by: Cameron Ali Pre-anesthesia Checklist: Patient identified, Emergency Drugs available, Suction available, Timeout performed and Patient being monitored Patient Re-evaluated:Patient Re-evaluated prior to inductionOxygen Delivery Method: Nasal cannula Placement Confirmation: positive ETCO2

## 2015-11-25 NOTE — Op Note (Signed)
[facility] Patient Name: Kathleen Lee Procedure Date: 11/25/2015 10:23 AM MRN: [id number] Account #: [id number] Date of Birth: 1956-09-27 Admit Type: Outpatient Age: 60 Room: [facility] OR ROOM 01 Gender: Female Note Status: Finalized Procedure:         Colonoscopy Indications:       Chronic diarrhea, Iron deficiency anemia Providers:         Lucilla Lame, MD Medicines:         Propofol per Anesthesia Complications:     No immediate complications. Procedure:         Pre-Anesthesia Assessment:                    - Prior to the procedure, a History and Physical was                     performed, and patient medications and allergies were                     reviewed. The patient's tolerance of previous anesthesia                     was also reviewed. The risks and benefits of the procedure                     and the sedation options and risks were discussed with the                     patient. All questions were answered, and informed consent                     was obtained. Prior Anticoagulants: The patient has taken                     no previous anticoagulant or antiplatelet agents. ASA                     Grade Assessment: II - A patient with mild systemic                     disease. After reviewing the risks and benefits, the                     patient was deemed in satisfactory condition to undergo                     the procedure.                    After obtaining informed consent, the colonoscope was                     passed under direct vision. Throughout the procedure, the                     patient's blood pressure, pulse, and oxygen saturations                     were monitored continuously. The Olympus CF H180AL                     colonoscope (S#: [id number]) was introduced through the anus                     and advanced to the the terminal ileum. The  colonoscopy                     was performed without difficulty. The patient  tolerated                     the procedure well. The quality of the bowel preparation                     was poor. Findings:      The perianal and digital rectal examinations were normal.      The terminal ileum appeared normal. Biopsies were taken with a cold       forceps for histology.      Multiple small-mouthed diverticula were found in the sigmoid colon.      A 12 mm polypoid lesion was found at the appendiceal orifice. The lesion       was semi-pedunculated. No bleeding was present. This was biopsied with a       cold forceps for histology.      Non-bleeding internal hemorrhoids were found during retroflexion. The       hemorrhoids were Grade II (internal hemorrhoids that prolapse but reduce       spontaneously).      Random biopsies were obtained with cold forceps for histology in the       entire colon. Impression:        - Preparation of the colon was poor.                    - The examined portion of the ileum was normal. Biopsied.                    - Diverticulosis in the sigmoid colon.                    - Polypoid lesion at the appendiceal orifice. Biopsied.                    - Non-bleeding internal hemorrhoids.                    - Random biopsies were obtained in the entire colon. Recommendation:    - Await pathology results.                    - Perform CT scan (computed tomography) of the abdomen                     with contrast. Procedure Code(s): --- Professional ---                    [phone], Colonoscopy, flexible; with biopsy, single or                     multiple Diagnosis Code(s): --- Professional ---                    D50.9, Iron deficiency anemia, unspecified                    K52.9, Noninfective gastroenteritis and colitis,                     unspecified                    D49.0, Neoplasm of unspecified behavior of digestive system CPT copyright 2014 American  Medical Association. All rights reserved. The codes documented in this report are  preliminary and upon coder review may  be revised to meet current compliance requirements. Lucilla Lame, MD 11/25/2015 11:15:11 AM This report has been signed electronically. Number of Addenda: 0 Note Initiated On: 11/25/2015 10:23 AM Scope Withdrawal Time: 0 hours 8 minutes 36 seconds  Total Procedure Duration: 0 hours 15 minutes 30 seconds       [facility]

## 2015-11-25 NOTE — Op Note (Signed)
[facility] Patient Name: Kathleen Lee Procedure Date: 11/25/2015 10:23 AM MRN: [id number] Account #: [id number] Date of Birth: 1956-10-13 Admit Type: Outpatient Age: 60 Room: [facility] OR ROOM 01 Gender: Female Note Status: Finalized Procedure:         Upper GI endoscopy Indications:       Iron deficiency anemia Providers:         Lucilla Lame, MD Referring MD:      Satira Anis. Plonk, MD (Referring MD) Medicines:         Propofol per Anesthesia Complications:     Hypoxia Procedure:         Pre-Anesthesia Assessment:                    - Prior to the procedure, a History and Physical was                     performed, and patient medications and allergies were                     reviewed. The patient's tolerance of previous anesthesia                     was also reviewed. The risks and benefits of the procedure                     and the sedation options and risks were discussed with the                     patient. All questions were answered, and informed consent                     was obtained. Prior Anticoagulants: The patient has taken                     no previous anticoagulant or antiplatelet agents. ASA                     Grade Assessment: II - A patient with mild systemic                     disease. After reviewing the risks and benefits, the                     patient was deemed in satisfactory condition to undergo                     the procedure.                    After obtaining informed consent, the endoscope was passed                     under direct vision. Throughout the procedure, the                     patient's blood pressure, pulse, and oxygen saturations                     were monitored continuously. The Olympus GIF H180J                     endoscope (S#: [id number]) was introduced through the mouth,  and advanced to the second part of duodenum. The upper GI                     endoscopy was  accomplished without difficulty. The patient                     tolerated the procedure well. Findings:      Localized mild inflammation characterized by erythema was found in the       gastric antrum. Biopsies were taken with a cold forceps for histology.      The examined esophagus was normal.      The examined duodenum was normal. Impression:        - Gastritis. Biopsied.                    - Normal esophagus.                    - Normal examined duodenum. Recommendation:    - Await pathology results.                    - Perform a colonoscopy today. Procedure Code(s): --- Professional ---                    [phone], Esophagogastroduodenoscopy, flexible, transoral;                     with biopsy, single or multiple Diagnosis Code(s): --- Professional ---                    D50.9, Iron deficiency anemia, unspecified                    K29.70, Gastritis, unspecified, without bleeding CPT copyright 2014 American Medical Association. All rights reserved. The codes documented in this report are preliminary and upon coder review may  be revised to meet current compliance requirements. Lucilla Lame, MD 11/25/2015 10:49:53 AM This report has been signed electronically. Number of Addenda: 0 Note Initiated On: 11/25/2015 10:23 AM Total Procedure Duration: 0 hours 6 minutes 47 seconds       [facility]

## 2015-11-25 NOTE — Anesthesia Postprocedure Evaluation (Signed)
Anesthesia Post Note  Patient: Kathleen Lee  Procedure(s) Performed: Procedure(s) (LRB): COLONOSCOPY WITH PROPOFOL (N/A) ESOPHAGOGASTRODUODENOSCOPY (EGD) WITH PROPOFOL (N/A)  Patient location during evaluation: PACU Anesthesia Type: MAC Level of consciousness: awake and alert and oriented Pain management: pain level controlled Vital Signs Assessment: post-procedure vital signs reviewed and stable Respiratory status: spontaneous breathing and nonlabored ventilation Cardiovascular status: stable Postop Assessment: no signs of nausea or vomiting and adequate PO intake Anesthetic complications: no    Estill Batten

## 2015-11-25 NOTE — Transfer of Care (Signed)
Immediate Anesthesia Transfer of Care Note  Patient: Kathleen Lee  Procedure(s) Performed: Procedure(s) with comments: COLONOSCOPY WITH PROPOFOL (N/A) ESOPHAGOGASTRODUODENOSCOPY (EGD) WITH PROPOFOL (N/A) - Diabetic - insulin and oral meds Sleep apnea  Patient Location: PACU  Anesthesia Type: MAC  Level of Consciousness: awake, alert  and patient cooperative  Airway and Oxygen Therapy: Patient Spontanous Breathing and Patient connected to supplemental oxygen  Post-op Assessment: Post-op Vital signs reviewed, Patient's Cardiovascular Status Stable, Respiratory Function Stable, Patent Airway and No signs of Nausea or vomiting  Post-op Vital Signs: Reviewed and stable  Complications: No apparent anesthesia complications

## 2015-11-28 ENCOUNTER — Encounter: Payer: Self-pay | Admitting: Gastroenterology

## 2015-11-30 DIAGNOSIS — L4059 Other psoriatic arthropathy: Secondary | ICD-10-CM | POA: Diagnosis not present

## 2015-12-01 ENCOUNTER — Other Ambulatory Visit: Payer: Self-pay

## 2015-12-02 ENCOUNTER — Encounter: Payer: Self-pay | Admitting: Family Medicine

## 2015-12-07 ENCOUNTER — Other Ambulatory Visit: Payer: Self-pay

## 2015-12-07 ENCOUNTER — Telehealth: Payer: Self-pay

## 2015-12-07 DIAGNOSIS — R933 Abnormal findings on diagnostic imaging of other parts of digestive tract: Secondary | ICD-10-CM

## 2015-12-07 DIAGNOSIS — K639 Disease of intestine, unspecified: Secondary | ICD-10-CM

## 2015-12-07 NOTE — Telephone Encounter (Signed)
Pt notified of colonoscopy results. Pt is aware Dr. Allen Norris is wanting her to have a CT scan. Order has been put in.

## 2015-12-12 ENCOUNTER — Other Ambulatory Visit: Payer: Self-pay

## 2015-12-12 DIAGNOSIS — K639 Disease of intestine, unspecified: Secondary | ICD-10-CM

## 2015-12-12 DIAGNOSIS — R933 Abnormal findings on diagnostic imaging of other parts of digestive tract: Secondary | ICD-10-CM

## 2015-12-19 ENCOUNTER — Ambulatory Visit: Payer: Medicare Other

## 2015-12-19 ENCOUNTER — Other Ambulatory Visit: Payer: Self-pay | Admitting: Family Medicine

## 2015-12-19 MED ORDER — METFORMIN HCL 500 MG PO TABS: 500.0000 mg | ORAL_TABLET | Freq: Two times a day (BID) | ORAL | Status: DC

## 2015-12-22 ENCOUNTER — Ambulatory Visit: Admission: RE | Admit: 2015-12-22 | Payer: Medicare Other | Source: Ambulatory Visit

## 2015-12-27 ENCOUNTER — Other Ambulatory Visit
Admission: RE | Admit: 2015-12-27 | Discharge: 2015-12-27 | Disposition: A | Discharge status: Home / Self Care | Payer: Medicare Other | Source: Ambulatory Visit | Attending: Gastroenterology | Admitting: Gastroenterology

## 2015-12-27 ENCOUNTER — Ambulatory Visit
Admission: RE | Admit: 2015-12-27 | Discharge: 2015-12-27 | Disposition: A | Discharge status: Home / Self Care | Payer: Medicare Other | Source: Ambulatory Visit | Attending: Gastroenterology | Admitting: Gastroenterology

## 2015-12-27 ENCOUNTER — Other Ambulatory Visit: Payer: Self-pay

## 2015-12-27 DIAGNOSIS — Z9049 Acquired absence of other specified parts of digestive tract: Secondary | ICD-10-CM | POA: Diagnosis not present

## 2015-12-27 DIAGNOSIS — R933 Abnormal findings on diagnostic imaging of other parts of digestive tract: Secondary | ICD-10-CM | POA: Diagnosis not present

## 2015-12-27 DIAGNOSIS — E279 Disorder of adrenal gland, unspecified: Secondary | ICD-10-CM | POA: Insufficient documentation

## 2015-12-27 DIAGNOSIS — Z01818 Encounter for other preprocedural examination: Secondary | ICD-10-CM

## 2015-12-27 DIAGNOSIS — K5900 Constipation, unspecified: Secondary | ICD-10-CM | POA: Insufficient documentation

## 2015-12-27 DIAGNOSIS — K639 Disease of intestine, unspecified: Secondary | ICD-10-CM | POA: Insufficient documentation

## 2015-12-27 LAB — CREATININE, SERUM
CREATININE: 1.06 mg/dL — AB (ref 0.44–1.00)
GFR calc Af Amer: >60 mL/min (ref >60)
GFR calc non Af Amer: 57 mL/min — ABNORMAL LOW (ref >60)

## 2015-12-27 MED ORDER — IOHEXOL 300 MG/ML  SOLN
100.0000 mL | Freq: Once | INTRAMUSCULAR | Status: AC | PRN
  Administered 2015-12-27: 100 mL via INTRAVENOUS

## 2015-12-28 ENCOUNTER — Telehealth: Payer: Self-pay

## 2015-12-28 ENCOUNTER — Other Ambulatory Visit: Payer: Self-pay | Admitting: Family Medicine

## 2015-12-28 DIAGNOSIS — E278 Other specified disorders of adrenal gland: Secondary | ICD-10-CM

## 2015-12-28 NOTE — Telephone Encounter (Signed)
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

## 2015-12-28 NOTE — Telephone Encounter (Signed)
LVM for pt to return my call.

## 2015-12-28 NOTE — Telephone Encounter (Signed)
Message has been forwarded to Dr. Vicente Masson with [facility]

## 2015-12-29 NOTE — Progress Notes (Unsigned)
Patient ID: Kathleen Lee, female   DOB: [birth year], 60 y.o.   MRN: [id number]  Patient seen in office on  09/28/2015 for Sleep apnea.  Diagnosis is on the OV note attached.   Patient moved here from [place] and broke her CPAP machine during the move.  We sent her for Sleep Study 11/2015. She was denied CPAP due to OV not stating symptoms.    She complains of severe fatigue during day from having trouble sleeping without CPAP.  Please accept this as a note and deliver patients CPAP as soon as possible as it is affecting her health to go this long without it.

## 2015-12-29 NOTE — Telephone Encounter (Signed)
Pt returned call and was notified of results. Advised pt I have sent message to Dr. Vicente Masson and she should go ahead and call to schedule a follow up to discuss.

## 2016-01-04 DIAGNOSIS — M545 Low back pain: Secondary | ICD-10-CM | POA: Diagnosis not present

## 2016-01-04 DIAGNOSIS — G8929 Other chronic pain: Secondary | ICD-10-CM | POA: Diagnosis not present

## 2016-01-09 ENCOUNTER — Encounter: Payer: Self-pay | Admitting: Family Medicine

## 2016-01-09 ENCOUNTER — Ambulatory Visit (INDEPENDENT_AMBULATORY_CARE_PROVIDER_SITE_OTHER): Payer: Medicare Other | Admitting: Family Medicine

## 2016-01-09 VITALS — BP 140/72 | HR 84 | Temp 98.1°F | Resp 16 | Ht 63.0 in | Wt 214.0 lb

## 2016-01-09 DIAGNOSIS — E1165 Type 2 diabetes mellitus with hyperglycemia: Secondary | ICD-10-CM | POA: Diagnosis not present

## 2016-01-09 DIAGNOSIS — Z720 Tobacco use: Secondary | ICD-10-CM | POA: Diagnosis not present

## 2016-01-09 DIAGNOSIS — N183 Chronic kidney disease, stage 3 unspecified: Secondary | ICD-10-CM

## 2016-01-09 DIAGNOSIS — D509 Iron deficiency anemia, unspecified: Secondary | ICD-10-CM | POA: Diagnosis not present

## 2016-01-09 DIAGNOSIS — J449 Chronic obstructive pulmonary disease, unspecified: Secondary | ICD-10-CM

## 2016-01-09 DIAGNOSIS — J40 Bronchitis, not specified as acute or chronic: Secondary | ICD-10-CM

## 2016-01-09 DIAGNOSIS — I10 Essential (primary) hypertension: Secondary | ICD-10-CM | POA: Diagnosis not present

## 2016-01-09 DIAGNOSIS — L409 Psoriasis, unspecified: Secondary | ICD-10-CM | POA: Diagnosis not present

## 2016-01-09 DIAGNOSIS — IMO0002 Reserved for concepts with insufficient information to code with codable children: Secondary | ICD-10-CM

## 2016-01-09 DIAGNOSIS — Z79899 Other long term (current) drug therapy: Secondary | ICD-10-CM | POA: Diagnosis not present

## 2016-01-09 DIAGNOSIS — E66812 Obesity, class 2: Secondary | ICD-10-CM

## 2016-01-09 DIAGNOSIS — E1140 Type 2 diabetes mellitus with diabetic neuropathy, unspecified: Secondary | ICD-10-CM | POA: Diagnosis not present

## 2016-01-09 DIAGNOSIS — E559 Vitamin D deficiency, unspecified: Secondary | ICD-10-CM | POA: Diagnosis not present

## 2016-01-09 DIAGNOSIS — E669 Obesity, unspecified: Secondary | ICD-10-CM | POA: Diagnosis not present

## 2016-01-09 DIAGNOSIS — L4050 Arthropathic psoriasis, unspecified: Secondary | ICD-10-CM | POA: Diagnosis not present

## 2016-01-09 DIAGNOSIS — G4733 Obstructive sleep apnea (adult) (pediatric): Secondary | ICD-10-CM

## 2016-01-09 MED ORDER — ALBUTEROL SULFATE HFA 108 (90 BASE) MCG/ACT IN AERS: 2.0000 | INHALATION_SPRAY | Freq: Four times a day (QID) | RESPIRATORY_TRACT | Status: DC | PRN

## 2016-01-09 MED ORDER — BECLOMETHASONE DIPROPIONATE 80 MCG/ACT IN AERS: 2.0000 | INHALATION_SPRAY | Freq: Two times a day (BID) | RESPIRATORY_TRACT | Status: DC

## 2016-01-09 MED ORDER — PREDNISONE 20 MG PO TABS: 20.0000 mg | ORAL_TABLET | Freq: Two times a day (BID) | ORAL | Status: DC

## 2016-01-09 MED ORDER — DOXYCYCLINE HYCLATE 100 MG PO CAPS: 100.0000 mg | ORAL_CAPSULE | Freq: Two times a day (BID) | ORAL | Status: DC

## 2016-01-09 MED ORDER — ALBUTEROL SULFATE (2.5 MG/3ML) 0.083% IN NEBU
2.5000 mg | INHALATION_SOLUTION | Freq: Once | RESPIRATORY_TRACT | Status: AC
  Administered 2016-01-09: 2.5 mg via RESPIRATORY_TRACT

## 2016-01-10 ENCOUNTER — Other Ambulatory Visit: Payer: Self-pay

## 2016-01-10 ENCOUNTER — Telehealth: Payer: Self-pay

## 2016-01-10 DIAGNOSIS — J449 Chronic obstructive pulmonary disease, unspecified: Secondary | ICD-10-CM | POA: Insufficient documentation

## 2016-01-10 LAB — MICROALBUMIN / CREATININE URINE RATIO
Creatinine, Urine: 73 mg/dL
MICROALB/CREAT RATIO: 2061.5 mg/g creat — ABNORMAL HIGH (ref 0.0–30.0)
Microalbumin, Urine: 1504.9 ug/mL

## 2016-01-10 LAB — BASIC METABOLIC PANEL
BUN / CREAT RATIO: 18 (ref 9–23)
BUN: 24 mg/dL (ref 6–24)
CALCIUM: 8.5 mg/dL — AB (ref 8.7–10.2)
CHLORIDE: 100 mmol/L (ref 96–106)
CO2: 26 mmol/L (ref 18–29)
Creatinine, Ser: 1.37 mg/dL — ABNORMAL HIGH (ref 0.57–1.00)
GFR calc non Af Amer: 43 mL/min/{1.73_m2} — ABNORMAL LOW (ref >59)
GFR, EST AFRICAN AMERICAN: 49 mL/min/{1.73_m2} — AB (ref >59)
GLUCOSE: 166 mg/dL — AB (ref 65–99)
POTASSIUM: 4.4 mmol/L (ref 3.5–5.2)
Sodium: 143 mmol/L (ref 134–144)

## 2016-01-10 LAB — HEMOGLOBIN A1C
Est. average glucose Bld gHb Est-mCnc: 143 mg/dL
HEMOGLOBIN A1C: 6.6 % — AB (ref 4.8–5.6)

## 2016-01-10 MED ORDER — GLUCOSE BLOOD VI STRP: ORAL_STRIP | Status: DC

## 2016-01-10 MED ORDER — FLUTICASONE PROPIONATE 50 MCG/ACT NA SUSP: 2.0000 | Freq: Every day | NASAL | Status: DC

## 2016-01-10 MED ORDER — LISINOPRIL 40 MG PO TABS: 40.0000 mg | ORAL_TABLET | Freq: Every day | ORAL | Status: DC

## 2016-01-10 NOTE — Addendum Note (Signed)
Addended by: [name] on: 01/10/2016 04:30 PM   Modules accepted: Orders, SmartSet

## 2016-01-10 NOTE — Progress Notes (Signed)
Date:  01/09/2016   Name:  Kathleen Lee   DOB:  06/23/1956   MRN:  [id number]  PCP:  [name], MD    Chief Complaint: Shortness of Breath; Cough; and Sleep Apnea   History of Present Illness:  This is a 60 y.o. female with one week hx chest congestion, initial fever, cough prod yellow phlegm. Feels she needs abx and prednisone. Colonoscopy showed appendix mass not seen on CT but adrenal mass noted, scheduled to see endo. Still taking iron supplement for anemia. Quit smoking in Jan. C/o daytime fatigue and severe snoring overnight, dx'd with OSA and placed on CPAP with improvement 5 years ago. Needs refill Qvar, uses daily. Saw rheum today for psoriatic arthritis, CBC showed Hct 30.  Review of Systems:  Review of Systems  Constitutional: Negative for fever.  Cardiovascular: Negative for chest pain and leg swelling.  Gastrointestinal: Negative for abdominal pain.  Endocrine: Negative for polyuria.  Genitourinary: Negative for difficulty urinating.  Neurological: Negative for syncope and light-headedness.    Patient Active Problem List   Diagnosis Date Noted  . Iron deficiency anemia, unspecified   . Idiopathic colitis   . Neoplasm of digestive system   . Gastritis   . Diabetic retinopathy, background ([place]) 11/14/2015  . CKD (chronic kidney disease) stage 3, GFR 30-59 ml/min 09/29/2015  . Vitamin D deficiency 09/29/2015  . Hypertension 09/28/2015  . Type 2 diabetes, uncontrolled, with neuropathy ([place]) 09/28/2015  . Hx of acute renal failure 09/28/2015  . Hyperlipidemia 09/28/2015  . Psoriatic arthritis ([place]) 09/28/2015  . Obesity, Class II, BMI 35-39.9 09/28/2015  . Spinal stenosis 09/28/2015  . GERD (gastroesophageal reflux disease) 09/28/2015  . Allergic rhinitis 09/28/2015  . Chronic back pain 09/28/2015  . OSA (obstructive sleep apnea) 09/28/2015  . Smoker 09/28/2015  . History of left hip replacement 09/28/2015    Prior to Admission medications   Medication Sig  Start Date End Date Taking? Authorizing Provider  albuterol (PROVENTIL HFA;VENTOLIN HFA) 108 (90 Base) MCG/ACT inhaler Inhale 2 puffs into the lungs every 6 (six) hours as needed for wheezing or shortness of breath. 01/09/16  Yes [name], MD  Apremilast (OTEZLA) 30 MG TABS Take 1 tablet by mouth 2 (two) times daily.   Yes Historical Provider, MD  atorvastatin (LIPITOR) 40 MG tablet Take 40 mg by mouth every evening. for cholesterol 09/21/15  Yes Historical Provider, MD  BD PEN NEEDLE NANO U/F 32G X 4 MM MISC USE THREE TIMES A DAY WITH MEALS 11/24/15  Yes Historical Provider, MD  beclomethasone (QVAR) 80 MCG/ACT inhaler Inhale 2 puffs into the lungs 2 (two) times daily. 01/09/16  Yes [name], MD  diclofenac (VOLTAREN) 75 MG EC tablet Take by mouth. 01/04/16  Yes Historical Provider, MD  esomeprazole (NEXIUM) 20 MG capsule Take 20 mg by mouth daily at 12 noon.   Yes Historical Provider, MD  ferrous sulfate 325 (65 FE) MG tablet Take by mouth.   Yes Historical Provider, MD  fluticasone (VERAMYST) 27.5 MCG/SPRAY nasal spray Place 2 sprays into the nose daily.   Yes Historical Provider, MD  furosemide (LASIX) 40 MG tablet Take 40 mg by mouth daily.   Yes Historical Provider, MD  insulin aspart (NOVOLOG FLEXPEN) 100 UNIT/ML FlexPen Inject 60 Units into the skin 3 (three) times daily with meals.   Yes Historical Provider, MD  LANTUS SOLOSTAR 100 UNIT/ML Solostar Pen  12/19/15  Yes Historical Provider, MD  leflunomide (ARAVA) 10 MG tablet Take 10 mg by mouth  daily. 09/02/15  Yes Historical Provider, MD  lisinopril (PRINIVIL,ZESTRIL) 40 MG tablet Take 1 tablet (40 mg total) by mouth daily. 10/18/15  Yes [name], MD  metFORMIN (GLUCOPHAGE) 500 MG tablet Take 1 tablet (500 mg total) by mouth 2 (two) times daily with a meal. 12/19/15  Yes [name], MD  PATADAY 0.2 % SOLN instill 1 drop into both eyes once daily for itching 12/02/15  Yes Historical Provider, MD  pregabalin (LYRICA) 100 MG capsule  Take 100 mg by mouth 3 (three) times daily.   Yes Historical Provider, MD  traMADol (ULTRAM) 50 MG tablet Take 1 tablet (50 mg total) by mouth every 6 (six) hours as needed. Reported on 11/17/2015 11/17/15  Yes [name], MD  Vitamin D, Ergocalciferol, (DRISDOL) 50000 units CAPS capsule Take by mouth.   Yes Historical Provider, MD  doxycycline (VIBRAMYCIN) 100 MG capsule Take 1 capsule (100 mg total) by mouth 2 (two) times daily. 01/09/16   [name], MD  predniSONE (DELTASONE) 20 MG tablet Take 1 tablet (20 mg total) by mouth 2 (two) times daily with a meal. 01/09/16   [name], MD    No Known Allergies  Past Surgical History  Procedure Laterality Date  . Hip surgery Left   . Cholecystectomy    . Colonoscopy  2014    inconclusive  . Abdominal hysterectomy    . Colonoscopy with propofol N/A 11/25/2015    Procedure: COLONOSCOPY WITH PROPOFOL;  Surgeon: Lucilla Lame, MD;  Location: [facility];  Service: Endoscopy;  Laterality: N/A;  . Esophagogastroduodenoscopy (egd) with propofol N/A 11/25/2015    Procedure: ESOPHAGOGASTRODUODENOSCOPY (EGD) WITH PROPOFOL;  Surgeon: Lucilla Lame, MD;  Location: [facility];  Service: Endoscopy;  Laterality: N/A;  Diabetic - insulin and oral meds Sleep apnea    Social History  Substance Use Topics  . Smoking status: Former Smoker    Quit date: 10/23/2015  . Smokeless tobacco: Never Used  . Alcohol Use: No    Family History  Problem Relation Age of Onset  . Diabetes Father     Medication list has been reviewed and updated.  Physical Examination: BP 140/72 mmHg  Pulse 84  Temp(Src) 98.1 F (36.7 C) (Oral)  Resp 16  Ht 5\' 3"  (1.6 m)  Wt 214 lb (97.07 kg)  BMI 37.92 kg/m2  SpO2 94%  Physical Exam  Constitutional: She appears well-developed and well-nourished.  Cardiovascular: Normal rate, regular rhythm and normal heart sounds.   Pulmonary/Chest:  Diffuse exp wheezing  Musculoskeletal: She exhibits no edema.   Neurological: She is alert.  Skin: Skin is warm and dry.  Psychiatric: She has a normal mood and affect. Her behavior is normal.  Nursing note and vitals reviewed.   Assessment and Plan:  1. Bronchitis with bronchospasm Wheezing cleared with neb, doxy/pred x 5d - albuterol (PROVENTIL) (2.5 MG/3ML) 0.083% nebulizer solution 2.5 mg; Take 3 mLs (2.5 mg total) by nebulization once.  2. Chronic obstructive pulmonary disease, unspecified COPD type ([place]) Off cigs, refill Qvar  3. Essential hypertension Marginal control on lisinopril  4. OSA (obstructive sleep apnea) Sleep study pending for CPAP renewal (machine broke in move)  5. Type 2 diabetes, uncontrolled, with neuropathy (H[facility]) Unclear control on metformin/Lantus/Novolog - HgB A1c - Urine Microalbumin w/creat. ratio  6. CKD (chronic kidney disease) stage 3, GFR 30-59 ml/min Consider nephrology referral if progresses - Basic Metabolic Panel (BMET)  7. Obesity, Class II, BMI 35-39.9 Weight down 10#, cont weight loss  8. Iron  deficiency anemia, unspecified Anemia improved, cont iron supplementation  Return in about 3 months (around 04/10/2016).  Satira Anis. [facility]  01/10/2016

## 2016-01-10 NOTE — Telephone Encounter (Signed)
Anderson Malta called from [facility] and said to fax the notes from 01/09/2016 and the 09/28/2015 also as well as the progress note I typed in March. Please FAX to [phone] and she will try to see if Medicare will accept. She is almost positive they will have to repeat the Baseline Sleep Study.

## 2016-01-10 NOTE — Telephone Encounter (Signed)
Patient states that she needs refill sent in to the pharmacy today, because she is out of medication.

## 2016-01-11 ENCOUNTER — Telehealth: Payer: Self-pay

## 2016-01-11 NOTE — Telephone Encounter (Signed)
Faxed new notes for review [facility]

## 2016-01-11 NOTE — Telephone Encounter (Signed)
Faxed new notes for [facility] to have Medicare review. [facility]

## 2016-01-18 DIAGNOSIS — R718 Other abnormality of red blood cells: Secondary | ICD-10-CM | POA: Diagnosis not present

## 2016-01-18 DIAGNOSIS — D649 Anemia, unspecified: Secondary | ICD-10-CM | POA: Diagnosis not present

## 2016-01-19 DIAGNOSIS — N183 Chronic kidney disease, stage 3 (moderate): Secondary | ICD-10-CM | POA: Diagnosis not present

## 2016-01-19 DIAGNOSIS — R809 Proteinuria, unspecified: Secondary | ICD-10-CM | POA: Diagnosis not present

## 2016-01-19 DIAGNOSIS — I10 Essential (primary) hypertension: Secondary | ICD-10-CM | POA: Diagnosis not present

## 2016-01-19 DIAGNOSIS — E1122 Type 2 diabetes mellitus with diabetic chronic kidney disease: Secondary | ICD-10-CM | POA: Diagnosis not present

## 2016-01-27 ENCOUNTER — Telehealth: Payer: Self-pay

## 2016-01-27 NOTE — Telephone Encounter (Signed)
Called patient to check on status of new machine for sleep apnea and she said she was told we need to order a new one and see if the March visit notes will work for this. You can fax all the notes we have when you set it up.

## 2016-02-23 ENCOUNTER — Other Ambulatory Visit: Payer: Self-pay | Admitting: Family Medicine

## 2016-02-23 MED ORDER — GLUCOSE BLOOD VI STRP: ORAL_STRIP | Status: DC

## 2016-02-24 ENCOUNTER — Ambulatory Visit: Payer: Medicare Other | Attending: Otolaryngology

## 2016-02-24 DIAGNOSIS — G4733 Obstructive sleep apnea (adult) (pediatric): Secondary | ICD-10-CM | POA: Insufficient documentation

## 2016-02-24 DIAGNOSIS — F5101 Primary insomnia: Secondary | ICD-10-CM | POA: Insufficient documentation

## 2016-02-28 DIAGNOSIS — R252 Cramp and spasm: Secondary | ICD-10-CM | POA: Diagnosis not present

## 2016-02-28 DIAGNOSIS — M25552 Pain in left hip: Secondary | ICD-10-CM | POA: Diagnosis not present

## 2016-02-28 DIAGNOSIS — B351 Tinea unguium: Secondary | ICD-10-CM | POA: Diagnosis not present

## 2016-02-28 DIAGNOSIS — Z794 Long term (current) use of insulin: Secondary | ICD-10-CM | POA: Diagnosis not present

## 2016-02-28 DIAGNOSIS — E279 Disorder of adrenal gland, unspecified: Secondary | ICD-10-CM | POA: Diagnosis not present

## 2016-02-28 DIAGNOSIS — M7062 Trochanteric bursitis, left hip: Secondary | ICD-10-CM | POA: Diagnosis not present

## 2016-02-28 DIAGNOSIS — E1140 Type 2 diabetes mellitus with diabetic neuropathy, unspecified: Secondary | ICD-10-CM | POA: Diagnosis not present

## 2016-02-28 DIAGNOSIS — L851 Acquired keratosis [keratoderma] palmaris et plantaris: Secondary | ICD-10-CM | POA: Diagnosis not present

## 2016-03-01 ENCOUNTER — Other Ambulatory Visit: Payer: Self-pay | Admitting: Family Medicine

## 2016-03-01 MED ORDER — GLUCOSE BLOOD VI STRP: ORAL_STRIP | Status: DC

## 2016-03-05 DIAGNOSIS — E279 Disorder of adrenal gland, unspecified: Secondary | ICD-10-CM | POA: Diagnosis not present

## 2016-03-08 ENCOUNTER — Other Ambulatory Visit: Payer: Self-pay | Admitting: Internal Medicine

## 2016-03-08 DIAGNOSIS — E278 Other specified disorders of adrenal gland: Secondary | ICD-10-CM

## 2016-03-08 DIAGNOSIS — L4050 Arthropathic psoriasis, unspecified: Secondary | ICD-10-CM | POA: Diagnosis not present

## 2016-03-08 DIAGNOSIS — L409 Psoriasis, unspecified: Secondary | ICD-10-CM | POA: Diagnosis not present

## 2016-03-08 DIAGNOSIS — E279 Disorder of adrenal gland, unspecified: Principal | ICD-10-CM

## 2016-03-08 DIAGNOSIS — Z79899 Other long term (current) drug therapy: Secondary | ICD-10-CM | POA: Diagnosis not present

## 2016-03-08 DIAGNOSIS — M25552 Pain in left hip: Secondary | ICD-10-CM | POA: Diagnosis not present

## 2016-03-21 ENCOUNTER — Other Ambulatory Visit: Payer: Self-pay | Admitting: Family Medicine

## 2016-03-21 ENCOUNTER — Telehealth: Payer: Self-pay

## 2016-03-21 DIAGNOSIS — N183 Chronic kidney disease, stage 3 (moderate): Secondary | ICD-10-CM | POA: Diagnosis not present

## 2016-03-21 DIAGNOSIS — E1122 Type 2 diabetes mellitus with diabetic chronic kidney disease: Secondary | ICD-10-CM | POA: Diagnosis not present

## 2016-03-21 DIAGNOSIS — I10 Essential (primary) hypertension: Secondary | ICD-10-CM | POA: Diagnosis not present

## 2016-03-21 DIAGNOSIS — N2581 Secondary hyperparathyroidism of renal origin: Secondary | ICD-10-CM | POA: Diagnosis not present

## 2016-03-21 DIAGNOSIS — R600 Localized edema: Secondary | ICD-10-CM | POA: Diagnosis not present

## 2016-03-21 DIAGNOSIS — D631 Anemia in chronic kidney disease: Secondary | ICD-10-CM | POA: Diagnosis not present

## 2016-03-21 MED ORDER — PREGABALIN 100 MG PO CAPS: 100.0000 mg | ORAL_CAPSULE | Freq: Three times a day (TID) | ORAL | Status: DC

## 2016-03-21 NOTE — Telephone Encounter (Signed)
Rx printed and signed.  

## 2016-03-21 NOTE — Telephone Encounter (Signed)
Sent to [facility]

## 2016-03-23 ENCOUNTER — Ambulatory Visit: Admission: RE | Admit: 2016-03-23 | Payer: Medicare Other | Source: Ambulatory Visit

## 2016-03-23 ENCOUNTER — Ambulatory Visit
Admission: RE | Admit: 2016-03-23 | Discharge: 2016-03-23 | Disposition: A | Discharge status: Home / Self Care | Payer: Medicare Other | Source: Ambulatory Visit | Attending: Internal Medicine | Admitting: Internal Medicine

## 2016-03-23 DIAGNOSIS — R195 Other fecal abnormalities: Secondary | ICD-10-CM | POA: Insufficient documentation

## 2016-03-23 DIAGNOSIS — E278 Other specified disorders of adrenal gland: Secondary | ICD-10-CM

## 2016-03-23 DIAGNOSIS — E279 Disorder of adrenal gland, unspecified: Secondary | ICD-10-CM | POA: Diagnosis not present

## 2016-03-23 DIAGNOSIS — D3502 Benign neoplasm of left adrenal gland: Secondary | ICD-10-CM | POA: Diagnosis not present

## 2016-04-10 ENCOUNTER — Ambulatory Visit: Payer: Medicare Other | Admitting: Family Medicine

## 2016-04-16 ENCOUNTER — Inpatient Hospital Stay: Payer: Medicare Other | Attending: Internal Medicine | Admitting: Internal Medicine

## 2016-04-16 ENCOUNTER — Encounter: Payer: Self-pay | Admitting: Internal Medicine

## 2016-04-16 ENCOUNTER — Encounter (INDEPENDENT_AMBULATORY_CARE_PROVIDER_SITE_OTHER): Payer: Self-pay

## 2016-04-16 ENCOUNTER — Inpatient Hospital Stay: Payer: Medicare Other

## 2016-04-16 VITALS — BP 145/86 | HR 97 | Temp 97.5°F | Resp 18 | Wt 226.9 lb

## 2016-04-16 DIAGNOSIS — Z7952 Long term (current) use of systemic steroids: Secondary | ICD-10-CM | POA: Diagnosis not present

## 2016-04-16 DIAGNOSIS — R600 Localized edema: Secondary | ICD-10-CM | POA: Diagnosis not present

## 2016-04-16 DIAGNOSIS — D3502 Benign neoplasm of left adrenal gland: Secondary | ICD-10-CM | POA: Diagnosis not present

## 2016-04-16 DIAGNOSIS — G8929 Other chronic pain: Secondary | ICD-10-CM | POA: Diagnosis not present

## 2016-04-16 DIAGNOSIS — J449 Chronic obstructive pulmonary disease, unspecified: Secondary | ICD-10-CM

## 2016-04-16 DIAGNOSIS — M5430 Sciatica, unspecified side: Secondary | ICD-10-CM | POA: Insufficient documentation

## 2016-04-16 DIAGNOSIS — Z87891 Personal history of nicotine dependence: Secondary | ICD-10-CM

## 2016-04-16 DIAGNOSIS — G4730 Sleep apnea, unspecified: Secondary | ICD-10-CM | POA: Diagnosis not present

## 2016-04-16 DIAGNOSIS — D509 Iron deficiency anemia, unspecified: Secondary | ICD-10-CM | POA: Diagnosis not present

## 2016-04-16 DIAGNOSIS — E1122 Type 2 diabetes mellitus with diabetic chronic kidney disease: Secondary | ICD-10-CM | POA: Diagnosis not present

## 2016-04-16 DIAGNOSIS — Z794 Long term (current) use of insulin: Secondary | ICD-10-CM | POA: Diagnosis not present

## 2016-04-16 DIAGNOSIS — R5383 Other fatigue: Secondary | ICD-10-CM | POA: Insufficient documentation

## 2016-04-16 DIAGNOSIS — E279 Disorder of adrenal gland, unspecified: Secondary | ICD-10-CM | POA: Diagnosis not present

## 2016-04-16 DIAGNOSIS — L409 Psoriasis, unspecified: Secondary | ICD-10-CM | POA: Diagnosis not present

## 2016-04-16 DIAGNOSIS — L4050 Arthropathic psoriasis, unspecified: Secondary | ICD-10-CM | POA: Diagnosis not present

## 2016-04-16 DIAGNOSIS — K219 Gastro-esophageal reflux disease without esophagitis: Secondary | ICD-10-CM | POA: Diagnosis not present

## 2016-04-16 DIAGNOSIS — N183 Chronic kidney disease, stage 3 (moderate): Secondary | ICD-10-CM | POA: Diagnosis not present

## 2016-04-16 DIAGNOSIS — Z79899 Other long term (current) drug therapy: Secondary | ICD-10-CM | POA: Diagnosis not present

## 2016-04-16 DIAGNOSIS — I129 Hypertensive chronic kidney disease with stage 1 through stage 4 chronic kidney disease, or unspecified chronic kidney disease: Secondary | ICD-10-CM | POA: Insufficient documentation

## 2016-04-16 DIAGNOSIS — M1990 Unspecified osteoarthritis, unspecified site: Secondary | ICD-10-CM | POA: Diagnosis not present

## 2016-04-16 LAB — FERRITIN: Ferritin: 17 ng/mL (ref 11–307)

## 2016-04-16 LAB — IRON AND TIBC
Iron: 36 ug/dL (ref 28–170)
SATURATION RATIOS: 9 % — AB (ref 10.4–31.8)
TIBC: 388 ug/dL (ref 250–450)
UIBC: 352 ug/dL

## 2016-04-16 NOTE — Assessment & Plan Note (Addendum)
Iron deficiency anemia/the context of chronic kidney disease. Negative GI workup. Patient intolerant to by mouth iron twice a day. Recommend IV iron- Venofer 200 mg weekly 4. Discussed the potential IV infusion reactions with IV iron.  # Plan to recheck iron studies in 3 months/1 week prior- possible IV iron/follow-up with me.  Thank you Dr.Lateef for allowing me to participate in the care of your pleasant patient. Please do not hesitate to contact me with questions or concerns in the interim.

## 2016-04-16 NOTE — Progress Notes (Signed)
Patient here today as new evaluation regarding anemia.  Referred by Dr. Lateef. 

## 2016-04-16 NOTE — Progress Notes (Signed)
[facility] NOTE  Patient Care Team: [name], MD as PCP - General (Family Medicine)  CHIEF COMPLAINTS/PURPOSE OF CONSULTATION:   # DEC 2016- ANEMIA IRON DEF [Hb-7.27 Nov 2015- EGD/[place]-Dr.Wohl- NEG]on PO iron; June 2017- IV venoferx4  # 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  female with above history of CKD- secondary to diabetes hypertension- and also history of iron deficiency anemia has been referred to [place] for further evaluation and recommendations.  Review of labs shows hemoglobin of 7.5 in December 2016l microcytic/iron studies suggestive of iron deficiency. Patient has been on by mouth iron once a day- and improvement of her hemoglobin between 9-10. However MCV still around mid 70s. Most recent iron studies in June 2016 shows saturation of 5%. Patient is intolerant to by mouth iron twice a day because of constipation.  GI workup with colonoscopy EGD negative for any malignancy/obvious loss of blood  She complains of significant fatigue. Admits to shortness of breath on exertion. Denies any blood in stools black red stools.  ROS: A complete 10 point review of system is done which is negative except mentioned above in history of present illness. Patient has mild swelling in the legs chronic.  MEDICAL HISTORY:  Past Medical History  Diagnosis Date  . Diabetes mellitus without complication ([place])   . Hyperlipidemia   . Hypertension   . Hip pain   . COPD (chronic obstructive pulmonary disease) ([place])   . GERD (gastroesophageal reflux disease)   . Arthritis     psoriatic  . Anemia   . Sleep apnea 11/18/15    new Dx  . Spinal stenosis     lower back  . Chronic kidney disease     stage 3  . Allergy     SURGICAL HISTORY: Past Surgical History  Procedure Laterality Date  . Hip surgery Left   . Cholecystectomy    . Colonoscopy   2014    inconclusive  . Abdominal hysterectomy    . Colonoscopy with propofol N/A 11/25/2015    Procedure: COLONOSCOPY WITH PROPOFOL;  Surgeon: Lucilla Lame, MD;  Location: [facility];  Service: Endoscopy;  Laterality: N/A;  . Esophagogastroduodenoscopy (egd) with propofol N/A 11/25/2015    Procedure: ESOPHAGOGASTRODUODENOSCOPY (EGD) WITH PROPOFOL;  Surgeon: Lucilla Lame, MD;  Location: [facility];  Service: Endoscopy;  Laterality: N/A;  Diabetic - insulin and oral meds Sleep apnea    SOCIAL HISTORY: Social History   Social History  . Marital Status: Single    Spouse Name: N/A  . Number of Children: N/A  . Years of Education: N/A   Occupational History  . Not on file.   Social History Main Topics  . Smoking status: Former Smoker    Quit date: 10/23/2015  . Smokeless tobacco: Never Used  . Alcohol Use: No  . Drug Use: No  . Sexual Activity: Not Currently   Other Topics Concern  . Not on file   Social History Narrative    FAMILY HISTORY: Family History  Problem Relation Age of Onset  . Diabetes Father     ALLERGIES:  has No Known Allergies.  MEDICATIONS:  Current Outpatient Prescriptions  Medication Sig Dispense Refill  . Adalimumab 40 MG/0.8ML PNKT     . albuterol (PROVENTIL HFA;VENTOLIN HFA) 108 (90 Base) MCG/ACT inhaler Inhale 2 puffs into the lungs every 6 (six) hours as needed for  wheezing or shortness of breath. 18 g 2  . atorvastatin (LIPITOR) 40 MG tablet Take 40 mg by mouth every evening. for cholesterol  0  . BD PEN NEEDLE NANO U/F 32G X 4 MM MISC USE THREE TIMES A DAY WITH MEALS  0  . beclomethasone (QVAR) 80 MCG/ACT inhaler Inhale 2 puffs into the lungs 2 (two) times daily. 1 Inhaler 3  . esomeprazole (NEXIUM) 20 MG capsule Take 20 mg by mouth daily at 12 noon.    . ferrous sulfate 325 (65 FE) MG tablet Take by mouth.    . fluticasone (FLONASE) 50 MCG/ACT nasal spray Place 2 sprays into both nostrils daily. 1 g 5  . fluticasone (VERAMYST)  27.5 MCG/SPRAY nasal spray Place 2 sprays into the nose daily.    . furosemide (LASIX) 40 MG tablet Take 40 mg by mouth daily.    Marland Kitchen glucose blood test strip Use as instructed 100 each 12  . insulin aspart (NOVOLOG FLEXPEN) 100 UNIT/ML FlexPen Inject 60 Units into the skin 3 (three) times daily with meals.    Marland Kitchen LANTUS SOLOSTAR 100 UNIT/ML Solostar Pen   1  . leflunomide (ARAVA) 10 MG tablet Take 10 mg by mouth daily.  0  . leflunomide (ARAVA) 10 MG tablet Take by mouth.    Marland Kitchen lisinopril (PRINIVIL,ZESTRIL) 40 MG tablet Take 1 tablet (40 mg total) by mouth daily. 90 tablet 3  . metFORMIN (GLUCOPHAGE) 500 MG tablet Take 1 tablet (500 mg total) by mouth 2 (two) times daily with a meal. 180 tablet 3  . PATADAY 0.2 % SOLN instill 1 drop into both eyes once daily for itching  0  . predniSONE (DELTASONE) 20 MG tablet Take 1 tablet (20 mg total) by mouth 2 (two) times daily with a meal. 10 tablet 0  . pregabalin (LYRICA) 100 MG capsule Take 1 capsule (100 mg total) by mouth 3 (three) times daily. 90 capsule 5  . pregabalin (LYRICA) 100 MG capsule Take by mouth.    . traMADol (ULTRAM) 50 MG tablet Take 1 tablet (50 mg total) by mouth every 6 (six) hours as needed. Reported on 11/17/2015 30 tablet 2  . Vitamin D, Ergocalciferol, (DRISDOL) 50000 units CAPS capsule Take 1 capsule by mouth once a week.    Marland Kitchen amLODipine (NORVASC) 5 MG tablet   0  . Omega-3 Fatty Acids (FISH OIL) 1000 MG CAPS Take by mouth.     No current facility-administered medications for this visit.      Marland Kitchen  PHYSICAL EXAMINATION:  Filed Vitals:   04/16/16 1058  BP: 145/86  Pulse: 97  Temp: 97.5 F (36.4 C)  Resp: 18   Filed Weights   04/16/16 1058  Weight: 226 lb 13.7 oz (102.9 kg)    GENERAL: Well-nourished well-developed; Alert, no distress and comfortable.  Alone. Obese. Walks with a cane. EYES: no pallor or icterus OROPHARYNX: no thrush or ulceration; good dentition  NECK: supple, no masses felt LYMPH:  no palpable  lymphadenopathy in the cervical, axillary or inguinal regions LUNGS: clear to auscultation and  No wheeze or crackles HEART/CVS: regular rate & rhythm and no murmurs; 1+ bilateral lower extremity edema ABDOMEN: abdomen soft, non-tender and normal bowel sounds Musculoskeletal:no cyanosis of digits and no clubbing  PSYCH: alert & oriented x 3 with fluent speech NEURO: no focal motor/sensory deficits SKIN:  no rashes or significant lesions  LABORATORY DATA:  I have reviewed the data as listed Lab Results  Component Value Date  WBC 10.5 11/17/2015   HCT 24.5* 11/17/2015   MCV 67* 11/17/2015   PLT 356 11/17/2015    Recent Labs  09/28/15 1138 11/17/15 1059 12/27/15 0945 01/09/16 1518  NA 139 143  --  143  K 4.7 4.5  --  4.4  CL 100 100  --  100  CO2 27 23  --  26  GLUCOSE 101* 22*  --  166*  BUN 15 22  --  24  CREATININE 1.27* 1.42* 1.06* 1.37*  CALCIUM 8.3* 8.8  --  8.5*  GFRNONAA 47* 41* 57* 43*  GFRAA 54* 47* >60 49*  PROT 6.7  --   --   --   ALBUMIN 3.8  --   --   --   AST 14  --   --   --   ALT 11  --   --   --   ALKPHOS 113  --   --   --   BILITOT <0.2  --   --   --     RADIOGRAPHIC STUDIES: I have personally reviewed the radiological images as listed and agreed with the findings in the report. Mr Abdomen Wo Contrast  03/23/2016  CLINICAL DATA:  Left adrenal lesion on prior CT. EXAM: MRI ABDOMEN WITHOUT CONTRAST TECHNIQUE: Multiplanar multisequence MR imaging was performed without the administration of intravenous contrast. COMPARISON:  12/27/2015 FINDINGS: Minimal degradation secondary to motion and patient body habitus. Lower chest: Normal heart size without pericardial or pleural effusion. Hepatobiliary: Normal liver. Cholecystectomy, without biliary ductal dilatation. Pancreas: Normal, without mass or ductal dilatation. Spleen: Normal in size, without focal abnormality. Adrenals/Urinary Tract: Normal right adrenal gland. Bilateral T2 hyperintense renal lesions are  likely cysts. Left adrenal nodule measures 1.8 x 1.9 cm on image 28/ series 9. Similar. Demonstrates significant signal dropout on out of phase image 14/series 100. Stomach/Bowel: Normal stomach and abdominal small bowel loops. Colonic stool burden suggests constipation. Vascular/Lymphatic: Normal caliber of the aorta and branch vessels. No retroperitoneal or retrocrural adenopathy. Other: No ascites. Musculoskeletal: No acute osseous abnormality. IMPRESSION: 1. Left adrenal adenoma. 2.  Possible constipation. Electronically Signed   By: Abigail Miyamoto M.D.   On: 03/23/2016 11:29    ASSESSMENT & PLAN:  Iron deficiency anemia, unspecified Iron deficiency anemia/the context of chronic kidney disease. Negative GI workup. Patient intolerant to by mouth iron twice a day. Recommend IV iron- Venofer 200 mg weekly 4. Discussed the potential IV infusion reactions with IV iron.  # Plan to recheck iron studies in 3 months/1 week prior- possible IV iron/follow-up with me.  Thank you Dr.Lateef for allowing me to participate in the care of your pleasant patient. Please do not hesitate to contact me with questions or concerns in the interim.    All questions were answered. The patient knows to call the clinic with any problems, questions or concerns.   # 30 minutes face-to-face with the patient discussing the above plan of care; more than 50% of time spent on counseling and coordination.     Cammie Sickle, MD 04/16/2016 11:36 AM

## 2016-04-18 ENCOUNTER — Ambulatory Visit: Payer: Medicare Other | Admitting: Family Medicine

## 2016-04-20 ENCOUNTER — Encounter: Payer: Self-pay | Admitting: Family Medicine

## 2016-04-20 ENCOUNTER — Ambulatory Visit (INDEPENDENT_AMBULATORY_CARE_PROVIDER_SITE_OTHER): Payer: Medicare Other | Admitting: Family Medicine

## 2016-04-20 ENCOUNTER — Inpatient Hospital Stay: Payer: Medicare Other

## 2016-04-20 VITALS — BP 100/60 | HR 88 | Ht 63.0 in | Wt 226.0 lb

## 2016-04-20 VITALS — BP 125/74 | HR 85 | Temp 98.2°F | Resp 18

## 2016-04-20 DIAGNOSIS — D509 Iron deficiency anemia, unspecified: Secondary | ICD-10-CM

## 2016-04-20 DIAGNOSIS — E1140 Type 2 diabetes mellitus with diabetic neuropathy, unspecified: Secondary | ICD-10-CM

## 2016-04-20 DIAGNOSIS — L4050 Arthropathic psoriasis, unspecified: Secondary | ICD-10-CM

## 2016-04-20 DIAGNOSIS — N183 Chronic kidney disease, stage 3 unspecified: Secondary | ICD-10-CM

## 2016-04-20 DIAGNOSIS — IMO0002 Reserved for concepts with insufficient information to code with codable children: Secondary | ICD-10-CM

## 2016-04-20 DIAGNOSIS — D3502 Benign neoplasm of left adrenal gland: Secondary | ICD-10-CM | POA: Diagnosis not present

## 2016-04-20 DIAGNOSIS — E559 Vitamin D deficiency, unspecified: Secondary | ICD-10-CM

## 2016-04-20 DIAGNOSIS — E1165 Type 2 diabetes mellitus with hyperglycemia: Secondary | ICD-10-CM | POA: Diagnosis not present

## 2016-04-20 DIAGNOSIS — I10 Essential (primary) hypertension: Secondary | ICD-10-CM | POA: Diagnosis not present

## 2016-04-20 DIAGNOSIS — E785 Hyperlipidemia, unspecified: Secondary | ICD-10-CM | POA: Diagnosis not present

## 2016-04-20 DIAGNOSIS — E1122 Type 2 diabetes mellitus with diabetic chronic kidney disease: Secondary | ICD-10-CM | POA: Diagnosis not present

## 2016-04-20 DIAGNOSIS — I129 Hypertensive chronic kidney disease with stage 1 through stage 4 chronic kidney disease, or unspecified chronic kidney disease: Secondary | ICD-10-CM | POA: Diagnosis not present

## 2016-04-20 DIAGNOSIS — R5383 Other fatigue: Secondary | ICD-10-CM | POA: Diagnosis not present

## 2016-04-20 MED ORDER — SODIUM CHLORIDE 0.9 % IV SOLN
Freq: Once | INTRAVENOUS | Status: AC
  Administered 2016-04-20: 14:00:00 via INTRAVENOUS
  Filled 2016-04-20: qty 1000

## 2016-04-20 MED ORDER — TRAMADOL HCL 50 MG PO TABS: 50.0000 mg | ORAL_TABLET | Freq: Four times a day (QID) | ORAL | Status: DC | PRN

## 2016-04-20 MED ORDER — SODIUM CHLORIDE 0.9 % IV SOLN
200.0000 mg | Freq: Once | INTRAVENOUS | Status: AC
  Administered 2016-04-20: 200 mg via INTRAVENOUS
  Filled 2016-04-20: qty 10

## 2016-04-20 NOTE — Progress Notes (Signed)
Date:  04/20/2016   Name:  Kathleen Lee   DOB:  10/14/1956   MRN:  [id number]  PCP:  [name], MD    Chief Complaint: Follow-up   History of Present Illness:  This is a 60 y.o. female seen for three month f/u. Receiving IV iron for anemia per hematology. Seeing nephro for CKD3 with proteinuria, started on amlodipine in addition to lisinopril, stopped Voltaren. Just started Humira for psoriatic arthritis per rheum. Adrenal nodule benign per endo. We are still managing DM. Requests tramadol refill, uses about once weekly for severe foot pain, on maximum recommended dose Lyrica. Weight up 12# from last visit.  Review of Systems:  Review of Systems  Constitutional: Negative for fever and fatigue.  Respiratory: Negative for cough and shortness of breath.   Cardiovascular: Negative for chest pain and leg swelling.  Endocrine: Negative for polyuria.  Genitourinary: Negative for difficulty urinating.  Neurological: Negative for syncope and light-headedness.    Patient Active Problem List   Diagnosis Date Noted  . COPD (chronic obstructive pulmonary disease) ([place]) 01/10/2016  . Iron deficiency anemia, unspecified   . Idiopathic colitis   . Neoplasm of digestive system   . Gastritis   . Diabetic retinopathy, background ([place]) 11/14/2015  . CKD (chronic kidney disease) stage 3, GFR 30-59 ml/min 09/29/2015  . Vitamin D deficiency 09/29/2015  . Hypertension 09/28/2015  . Type 2 diabetes, uncontrolled, with neuropathy ([place]) 09/28/2015  . Hx of acute renal failure 09/28/2015  . Hyperlipidemia 09/28/2015  . Psoriatic arthritis ([place]) 09/28/2015  . Obesity, Class III, BMI 40-49.9 (morbid obesity) ([place]) 09/28/2015  . Spinal stenosis 09/28/2015  . GERD (gastroesophageal reflux disease) 09/28/2015  . Allergic rhinitis 09/28/2015  . Chronic back pain 09/28/2015  . OSA (obstructive sleep apnea) 09/28/2015  . History of left hip replacement 09/28/2015    Prior to Admission medications    Medication Sig Start Date End Date Taking? Authorizing Provider  Adalimumab 40 MG/0.8ML PNKT  04/16/16  Yes Historical Provider, MD  albuterol (PROVENTIL HFA;VENTOLIN HFA) 108 (90 Base) MCG/ACT inhaler Inhale 2 puffs into the lungs every 6 (six) hours as needed for wheezing or shortness of breath. 01/09/16  Yes [name], MD  amLODipine (NORVASC) 5 MG tablet  04/11/16  Yes Historical Provider, MD  atorvastatin (LIPITOR) 40 MG tablet Take 40 mg by mouth every evening. for cholesterol 09/21/15  Yes Historical Provider, MD  BD PEN NEEDLE NANO U/F 32G X 4 MM MISC USE THREE TIMES A DAY WITH MEALS 11/24/15  Yes Historical Provider, MD  beclomethasone (QVAR) 80 MCG/ACT inhaler Inhale 2 puffs into the lungs 2 (two) times daily. 01/09/16  Yes [name], MD  esomeprazole (NEXIUM) 20 MG capsule Take 20 mg by mouth daily at 12 noon.   Yes Historical Provider, MD  ferrous sulfate 325 (65 FE) MG tablet Take by mouth.   Yes Historical Provider, MD  fluticasone (FLONASE) 50 MCG/ACT nasal spray Place 2 sprays into both nostrils daily. 01/10/16  Yes [name], MD  furosemide (LASIX) 40 MG tablet Take 40 mg by mouth daily.   Yes Historical Provider, MD  glucose blood test strip Use as instructed 03/01/16  Yes [name], MD  insulin aspart (NOVOLOG FLEXPEN) 100 UNIT/ML FlexPen Inject 60 Units into the skin 3 (three) times daily with meals.   Yes Historical Provider, MD  LANTUS SOLOSTAR 100 UNIT/ML Solostar Pen 26 Units at bedtime.  12/19/15  Yes Historical Provider, MD  leflunomide (ARAVA) 10 MG tablet Take 10  mg by mouth daily. 09/02/15  Yes Historical Provider, MD  lisinopril (PRINIVIL,ZESTRIL) 40 MG tablet Take 1 tablet (40 mg total) by mouth daily. 01/10/16  Yes [name], MD  metFORMIN (GLUCOPHAGE) 500 MG tablet Take 1 tablet (500 mg total) by mouth 2 (two) times daily with a meal. 12/19/15  Yes [name], MD  Omega-3 Fatty Acids (FISH OIL) 1000 MG CAPS Take by mouth.   Yes Historical Provider, MD   PATADAY 0.2 % SOLN instill 1 drop into both eyes once daily for itching 12/02/15  Yes Historical Provider, MD  pregabalin (LYRICA) 100 MG capsule Take 1 capsule (100 mg total) by mouth 3 (three) times daily. 03/21/16  Yes [name], MD  traMADol (ULTRAM) 50 MG tablet Take 1 tablet (50 mg total) by mouth every 6 (six) hours as needed. Reported on 11/17/2015 04/20/16  Yes [name], MD  Vitamin D, Ergocalciferol, (DRISDOL) 50000 units CAPS capsule Take 1 capsule by mouth once a week.   Yes Historical Provider, MD    No Known Allergies  Past Surgical History  Procedure Laterality Date  . Hip surgery Left   . Cholecystectomy    . Colonoscopy  2014    inconclusive  . Abdominal hysterectomy    . Colonoscopy with propofol N/A 11/25/2015    Procedure: COLONOSCOPY WITH PROPOFOL;  Surgeon: Lucilla Lame, MD;  Location: [facility];  Service: Endoscopy;  Laterality: N/A;  . Esophagogastroduodenoscopy (egd) with propofol N/A 11/25/2015    Procedure: ESOPHAGOGASTRODUODENOSCOPY (EGD) WITH PROPOFOL;  Surgeon: Lucilla Lame, MD;  Location: [facility];  Service: Endoscopy;  Laterality: N/A;  Diabetic - insulin and oral meds Sleep apnea    Social History  Substance Use Topics  . Smoking status: Former Smoker    Quit date: 10/23/2015  . Smokeless tobacco: Never Used  . Alcohol Use: No    Family History  Problem Relation Age of Onset  . Diabetes Father     Medication list has been reviewed and updated.  Physical Examination: BP 100/60 mmHg  Pulse 88  Ht 5\' 3"  (1.6 m)  Wt 226 lb (102.513 kg)  BMI 40.04 kg/m2  Physical Exam  Constitutional: She appears well-developed and well-nourished.  Cardiovascular: Normal rate, regular rhythm and normal heart sounds.   Pulmonary/Chest: Effort normal and breath sounds normal.  Musculoskeletal: She exhibits no edema.  Neurological: She is alert.  Skin: Skin is warm and dry.  Psychiatric: She has a normal mood and affect. Her behavior is  normal.  Nursing note and vitals reviewed.   Assessment and Plan:  1. Type 2 diabetes, uncontrolled, with neuropathy (HCC) Well controlled on metformin/Lantus, cont Lyrica, refill tramadol x 30 only - HgB A1c  2. Essential hypertension Well controlled on amlodipine/lisinopril  3. CKD (chronic kidney disease) stage 3, GFR 30-59 ml/min On amlodipine/lisinopril, nephro following  4. Psoriatic arthritis ([place]) On Humira, rheum following  5. Iron deficiency anemia, unspecified On IV iron per hematology  6. Vitamin D deficiency On Drisdol weekly - Vitamin D (25 hydroxy)  7. Hyperlipidemia Well controlled on Lipitor  8. Obesity, Class III, BMI 40-49.9 (morbid obesity) (HCC) Exercise/weight loss discussed  9. Med review Consider d/c Nexium, fish oil next visit  Return in about 3 months (around 07/21/2016).  Satira Anis. [facility]  04/20/2016

## 2016-04-21 LAB — HEMOGLOBIN A1C
ESTIMATED AVERAGE GLUCOSE: 189 mg/dL
HEMOGLOBIN A1C: 8.2 % — AB (ref 4.8–5.6)

## 2016-04-21 LAB — VITAMIN D 25 HYDROXY (VIT D DEFICIENCY, FRACTURES): VIT D 25 HYDROXY: 46.8 ng/mL (ref 30.0–100.0)

## 2016-04-22 ENCOUNTER — Other Ambulatory Visit: Payer: Self-pay | Admitting: Family Medicine

## 2016-04-22 MED ORDER — LANTUS SOLOSTAR 100 UNIT/ML ~~LOC~~ SOPN: 32.0000 [IU] | PEN_INJECTOR | Freq: Every day | SUBCUTANEOUS | Status: DC

## 2016-04-27 ENCOUNTER — Inpatient Hospital Stay: Payer: Medicare Other

## 2016-05-04 ENCOUNTER — Inpatient Hospital Stay: Payer: Medicare Other | Attending: Internal Medicine

## 2016-05-04 VITALS — BP 167/80 | HR 91 | Temp 97.5°F | Resp 18

## 2016-05-04 DIAGNOSIS — N183 Chronic kidney disease, stage 3 (moderate): Secondary | ICD-10-CM | POA: Insufficient documentation

## 2016-05-04 DIAGNOSIS — D509 Iron deficiency anemia, unspecified: Secondary | ICD-10-CM

## 2016-05-04 DIAGNOSIS — I129 Hypertensive chronic kidney disease with stage 1 through stage 4 chronic kidney disease, or unspecified chronic kidney disease: Secondary | ICD-10-CM | POA: Diagnosis not present

## 2016-05-04 MED ORDER — SODIUM CHLORIDE 0.9 % IV SOLN
Freq: Once | INTRAVENOUS | Status: AC
  Administered 2016-05-04: 14:00:00 via INTRAVENOUS
  Filled 2016-05-04: qty 1000

## 2016-05-04 MED ORDER — SODIUM CHLORIDE 0.9 % IV SOLN
200.0000 mg | Freq: Once | INTRAVENOUS | Status: AC
  Administered 2016-05-04: 200 mg via INTRAVENOUS
  Filled 2016-05-04: qty 10

## 2016-05-11 ENCOUNTER — Inpatient Hospital Stay: Payer: Medicare Other

## 2016-05-11 VITALS — BP 144/78 | HR 76 | Temp 96.1°F | Resp 20

## 2016-05-11 DIAGNOSIS — I129 Hypertensive chronic kidney disease with stage 1 through stage 4 chronic kidney disease, or unspecified chronic kidney disease: Secondary | ICD-10-CM | POA: Diagnosis not present

## 2016-05-11 DIAGNOSIS — N183 Chronic kidney disease, stage 3 (moderate): Secondary | ICD-10-CM | POA: Diagnosis not present

## 2016-05-11 DIAGNOSIS — D509 Iron deficiency anemia, unspecified: Secondary | ICD-10-CM | POA: Diagnosis not present

## 2016-05-11 MED ORDER — SODIUM CHLORIDE 0.9 % IV SOLN
200.0000 mg | Freq: Once | INTRAVENOUS | Status: AC
  Administered 2016-05-11: 200 mg via INTRAVENOUS
  Filled 2016-05-11: qty 10

## 2016-05-11 MED ORDER — SODIUM CHLORIDE 0.9 % IV SOLN
Freq: Once | INTRAVENOUS | Status: AC
  Administered 2016-05-11: 14:00:00 via INTRAVENOUS
  Filled 2016-05-11: qty 1000

## 2016-06-01 ENCOUNTER — Telehealth: Payer: Self-pay

## 2016-06-01 NOTE — Telephone Encounter (Addendum)
Wants Tramadol Refill. Has been prescribed 1 a week should have some left.   Advised needs appt or urgent care.

## 2016-06-08 DIAGNOSIS — E1140 Type 2 diabetes mellitus with diabetic neuropathy, unspecified: Secondary | ICD-10-CM | POA: Diagnosis not present

## 2016-06-08 DIAGNOSIS — L851 Acquired keratosis [keratoderma] palmaris et plantaris: Secondary | ICD-10-CM | POA: Diagnosis not present

## 2016-06-08 DIAGNOSIS — Z794 Long term (current) use of insulin: Secondary | ICD-10-CM | POA: Diagnosis not present

## 2016-06-08 DIAGNOSIS — B351 Tinea unguium: Secondary | ICD-10-CM | POA: Diagnosis not present

## 2016-06-11 ENCOUNTER — Other Ambulatory Visit: Payer: Self-pay

## 2016-06-11 ENCOUNTER — Encounter: Payer: Self-pay | Admitting: Internal Medicine

## 2016-06-11 ENCOUNTER — Other Ambulatory Visit: Payer: Self-pay | Admitting: Internal Medicine

## 2016-06-11 MED ORDER — BECLOMETHASONE DIPROPIONATE 80 MCG/ACT IN AERS: 2.0000 | INHALATION_SPRAY | Freq: Two times a day (BID) | RESPIRATORY_TRACT | 3 refills | Status: DC

## 2016-06-11 MED ORDER — FLUTICASONE PROPIONATE (INHAL) 250 MCG/BLIST IN AEPB: 1.0000 | INHALATION_SPRAY | Freq: Two times a day (BID) | RESPIRATORY_TRACT | 3 refills | Status: DC

## 2016-06-18 NOTE — Progress Notes (Signed)
Patient has been waiting a very long time for CPAP. We faxed over several before and after face to face encounters stating the patients condition. Patient having severe issues with sleeping. Patient suffers with fatigue during the day and recurrent acute URI symptoms. Patient has used CPAP machine in past but broke the machine during a move. Sleep Study was done on 10/2015 and was denied the CPAPA machine due to limited information and symptoms related to diagnosis. The Dx OSA was there but there was not enough information on patients symptoms. Will recheck annually if needed as patient uses CPAP once this is delivered.

## 2016-07-16 ENCOUNTER — Other Ambulatory Visit: Payer: Medicare Other

## 2016-07-23 ENCOUNTER — Inpatient Hospital Stay: Payer: Medicare Other | Attending: Internal Medicine

## 2016-07-23 ENCOUNTER — Ambulatory Visit: Payer: Medicare Other | Admitting: Internal Medicine

## 2016-07-23 ENCOUNTER — Ambulatory Visit: Payer: Medicare Other

## 2016-07-23 DIAGNOSIS — R5383 Other fatigue: Secondary | ICD-10-CM | POA: Diagnosis not present

## 2016-07-23 DIAGNOSIS — G8929 Other chronic pain: Secondary | ICD-10-CM | POA: Insufficient documentation

## 2016-07-23 DIAGNOSIS — Z794 Long term (current) use of insulin: Secondary | ICD-10-CM | POA: Diagnosis not present

## 2016-07-23 DIAGNOSIS — J449 Chronic obstructive pulmonary disease, unspecified: Secondary | ICD-10-CM | POA: Insufficient documentation

## 2016-07-23 DIAGNOSIS — G4730 Sleep apnea, unspecified: Secondary | ICD-10-CM | POA: Diagnosis not present

## 2016-07-23 DIAGNOSIS — D509 Iron deficiency anemia, unspecified: Secondary | ICD-10-CM | POA: Diagnosis not present

## 2016-07-23 DIAGNOSIS — I129 Hypertensive chronic kidney disease with stage 1 through stage 4 chronic kidney disease, or unspecified chronic kidney disease: Secondary | ICD-10-CM | POA: Insufficient documentation

## 2016-07-23 DIAGNOSIS — Z79899 Other long term (current) drug therapy: Secondary | ICD-10-CM | POA: Diagnosis not present

## 2016-07-23 DIAGNOSIS — D631 Anemia in chronic kidney disease: Secondary | ICD-10-CM | POA: Insufficient documentation

## 2016-07-23 DIAGNOSIS — E785 Hyperlipidemia, unspecified: Secondary | ICD-10-CM | POA: Diagnosis not present

## 2016-07-23 DIAGNOSIS — R0609 Other forms of dyspnea: Secondary | ICD-10-CM | POA: Insufficient documentation

## 2016-07-23 DIAGNOSIS — Z7984 Long term (current) use of oral hypoglycemic drugs: Secondary | ICD-10-CM | POA: Insufficient documentation

## 2016-07-23 DIAGNOSIS — N183 Chronic kidney disease, stage 3 (moderate): Secondary | ICD-10-CM | POA: Diagnosis not present

## 2016-07-23 DIAGNOSIS — M549 Dorsalgia, unspecified: Secondary | ICD-10-CM | POA: Diagnosis not present

## 2016-07-23 DIAGNOSIS — K219 Gastro-esophageal reflux disease without esophagitis: Secondary | ICD-10-CM | POA: Insufficient documentation

## 2016-07-23 DIAGNOSIS — D3502 Benign neoplasm of left adrenal gland: Secondary | ICD-10-CM | POA: Insufficient documentation

## 2016-07-23 DIAGNOSIS — E1122 Type 2 diabetes mellitus with diabetic chronic kidney disease: Secondary | ICD-10-CM | POA: Insufficient documentation

## 2016-07-23 DIAGNOSIS — Z87891 Personal history of nicotine dependence: Secondary | ICD-10-CM | POA: Diagnosis not present

## 2016-07-23 LAB — COMPREHENSIVE METABOLIC PANEL
ALBUMIN: 3.6 g/dL (ref 3.5–5.0)
ALT: 16 U/L (ref 14–54)
ANION GAP: 8 (ref 5–15)
AST: 18 U/L (ref 15–41)
Alkaline Phosphatase: 99 U/L (ref 38–126)
BILIRUBIN TOTAL: 0.3 mg/dL (ref 0.3–1.2)
BUN: 23 mg/dL — ABNORMAL HIGH (ref 6–20)
CALCIUM: 8.7 mg/dL — AB (ref 8.9–10.3)
CHLORIDE: 104 mmol/L (ref 101–111)
CO2: 27 mmol/L (ref 22–32)
CREATININE: 1.79 mg/dL — AB (ref 0.44–1.00)
GFR calc Af Amer: 35 mL/min — ABNORMAL LOW (ref >60)
GFR calc non Af Amer: 30 mL/min — ABNORMAL LOW (ref >60)
GLUCOSE: 65 mg/dL (ref 65–99)
POTASSIUM: 3.9 mmol/L (ref 3.5–5.1)
SODIUM: 139 mmol/L (ref 135–145)
TOTAL PROTEIN: 7.2 g/dL (ref 6.5–8.1)

## 2016-07-23 LAB — CBC WITH DIFFERENTIAL/PLATELET
BASOS ABS: 0.1 10*3/uL (ref 0–0.1)
BASOS PCT: 1 %
EOS ABS: 0.2 10*3/uL (ref 0–0.7)
Eosinophils Relative: 3 %
HEMATOCRIT: 33.4 % — AB (ref 35.0–47.0)
Hemoglobin: 11 g/dL — ABNORMAL LOW (ref 12.0–16.0)
Lymphocytes Relative: 29 %
Lymphs Abs: 1.9 10*3/uL (ref 1.0–3.6)
MCH: 26.6 pg (ref 26.0–34.0)
MCHC: 32.9 g/dL (ref 32.0–36.0)
MCV: 81 fL (ref 80.0–100.0)
MONO ABS: 0.7 10*3/uL (ref 0.2–0.9)
MONOS PCT: 10 %
NEUTROS ABS: 3.8 10*3/uL (ref 1.4–6.5)
NEUTROS PCT: 57 %
Platelets: 332 10*3/uL (ref 150–440)
RBC: 4.12 MIL/uL (ref 3.80–5.20)
RDW: 20 % — AB (ref 11.5–14.5)
WBC: 6.7 10*3/uL (ref 3.6–11.0)

## 2016-07-23 LAB — IRON AND TIBC
IRON: 20 ug/dL — AB (ref 28–170)
Saturation Ratios: 6 % — ABNORMAL LOW (ref 10.4–31.8)
TIBC: 332 ug/dL (ref 250–450)
UIBC: 312 ug/dL

## 2016-07-23 LAB — LACTATE DEHYDROGENASE: LDH: 207 U/L — AB (ref 98–192)

## 2016-07-23 LAB — FERRITIN: FERRITIN: 41 ng/mL (ref 11–307)

## 2016-07-24 DIAGNOSIS — R600 Localized edema: Secondary | ICD-10-CM | POA: Diagnosis not present

## 2016-07-24 DIAGNOSIS — N183 Chronic kidney disease, stage 3 (moderate): Secondary | ICD-10-CM | POA: Diagnosis not present

## 2016-07-24 DIAGNOSIS — N2581 Secondary hyperparathyroidism of renal origin: Secondary | ICD-10-CM | POA: Diagnosis not present

## 2016-07-24 DIAGNOSIS — I10 Essential (primary) hypertension: Secondary | ICD-10-CM | POA: Diagnosis not present

## 2016-07-24 DIAGNOSIS — E1122 Type 2 diabetes mellitus with diabetic chronic kidney disease: Secondary | ICD-10-CM | POA: Diagnosis not present

## 2016-07-24 DIAGNOSIS — D631 Anemia in chronic kidney disease: Secondary | ICD-10-CM | POA: Diagnosis not present

## 2016-07-27 ENCOUNTER — Encounter: Payer: Self-pay | Admitting: Internal Medicine

## 2016-07-27 ENCOUNTER — Ambulatory Visit (INDEPENDENT_AMBULATORY_CARE_PROVIDER_SITE_OTHER): Payer: Medicare Other | Admitting: Internal Medicine

## 2016-07-27 VITALS — BP 100/62 | HR 80 | Ht 63.0 in | Wt 225.0 lb

## 2016-07-27 DIAGNOSIS — M4807 Spinal stenosis, lumbosacral region: Secondary | ICD-10-CM

## 2016-07-27 DIAGNOSIS — E1140 Type 2 diabetes mellitus with diabetic neuropathy, unspecified: Secondary | ICD-10-CM

## 2016-07-27 DIAGNOSIS — N183 Chronic kidney disease, stage 3 unspecified: Secondary | ICD-10-CM

## 2016-07-27 DIAGNOSIS — E1165 Type 2 diabetes mellitus with hyperglycemia: Secondary | ICD-10-CM | POA: Diagnosis not present

## 2016-07-27 DIAGNOSIS — Z1231 Encounter for screening mammogram for malignant neoplasm of breast: Secondary | ICD-10-CM | POA: Diagnosis not present

## 2016-07-27 DIAGNOSIS — Z23 Encounter for immunization: Secondary | ICD-10-CM

## 2016-07-27 DIAGNOSIS — L4050 Arthropathic psoriasis, unspecified: Secondary | ICD-10-CM | POA: Diagnosis not present

## 2016-07-27 DIAGNOSIS — I10 Essential (primary) hypertension: Secondary | ICD-10-CM | POA: Diagnosis not present

## 2016-07-27 DIAGNOSIS — IMO0002 Reserved for concepts with insufficient information to code with codable children: Secondary | ICD-10-CM

## 2016-07-27 MED ORDER — LANTUS SOLOSTAR 100 UNIT/ML ~~LOC~~ SOPN: 32.0000 [IU] | PEN_INJECTOR | Freq: Every day | SUBCUTANEOUS | 1 refills | Status: DC

## 2016-07-27 MED ORDER — TRAMADOL HCL 50 MG PO TABS: 50.0000 mg | ORAL_TABLET | Freq: Four times a day (QID) | ORAL | 0 refills | Status: DC | PRN

## 2016-07-27 NOTE — Progress Notes (Signed)
Date:  07/27/2016   Name:  Kathleen Lee   DOB:  Nov 14, 1955   MRN:  [id number]   Chief Complaint: Diabetes (3 month check up/ Lantus refilled) and Back Pain (wants refill on Tramadol) Diabetes  She presents for her follow-up diabetic visit. She has type 2 diabetes mellitus. Her disease course has been stable. Associated symptoms include weakness. Pertinent negatives for diabetes include no chest pain, no fatigue and no polyuria. Her breakfast blood glucose is taken between 6-7 am. Her breakfast blood glucose range is generally 110-130 mg/dl. Her dinner blood glucose is taken between 5-6 pm. Her dinner blood glucose range is generally 180-200 mg/dl.  Back Pain  This is a chronic problem. The problem is unchanged. The pain is present in the lumbar spine. Associated symptoms include weakness. Pertinent negatives include no chest pain or fever. She has tried analgesics for the symptoms.  Renal insufficiency - had labs at Nephrology 2 days ago.  Had A1C done as well.  Has not received the results.  Last visit insulin dose was increased with no hypoglycemic episodes. Psoriatic Arthritis - followed by Rheumatology.  On Humira and Arava.  Lab Results  Component Value Date   HGBA1C 8.2 (H) 04/20/2016     Review of Systems  Constitutional: Negative for diaphoresis, fatigue and fever.  Respiratory: Negative for cough and shortness of breath.   Cardiovascular: Negative for chest pain and leg swelling.  Endocrine: Negative for polyuria.  Genitourinary: Negative for difficulty urinating.  Musculoskeletal: Positive for back pain and gait problem.  Neurological: Positive for weakness. Negative for syncope and light-headedness.  Hematological: Negative for adenopathy. Does not bruise/bleed easily.    Patient Active Problem List   Diagnosis Date Noted  . COPD (chronic obstructive pulmonary disease) ([place]) 01/10/2016  . Iron deficiency anemia   . Idiopathic colitis   . Neoplasm of digestive system     . Gastritis   . Diabetic retinopathy, background ([place]) 11/14/2015  . CKD (chronic kidney disease) stage 3, GFR 30-59 ml/min 09/29/2015  . Vitamin D deficiency 09/29/2015  . Hypertension 09/28/2015  . Type 2 diabetes, uncontrolled, with neuropathy ([place]) 09/28/2015  . Hx of acute renal failure 09/28/2015  . Hyperlipidemia 09/28/2015  . Psoriatic arthritis ([place]) 09/28/2015  . Obesity, Class III, BMI 40-49.9 (morbid obesity) ([place]) 09/28/2015  . Spinal stenosis 09/28/2015  . GERD (gastroesophageal reflux disease) 09/28/2015  . Allergic rhinitis 09/28/2015  . Chronic back pain 09/28/2015  . OSA (obstructive sleep apnea) 09/28/2015  . History of left hip replacement 09/28/2015    Prior to Admission medications   Medication Sig Start Date End Date Taking? Authorizing Provider  Adalimumab 40 MG/0.8ML PNKT  04/16/16  Yes Historical Provider, MD  albuterol (PROVENTIL HFA;VENTOLIN HFA) 108 (90 Base) MCG/ACT inhaler Inhale 2 puffs into the lungs every 6 (six) hours as needed for wheezing or shortness of breath. 01/09/16  Yes [name], MD  amLODipine (NORVASC) 5 MG tablet  04/11/16  Yes Historical Provider, MD  atorvastatin (LIPITOR) 40 MG tablet Take 40 mg by mouth every evening. for cholesterol 09/21/15  Yes Historical Provider, MD  BD PEN NEEDLE NANO U/F 32G X 4 MM MISC USE THREE TIMES A DAY WITH MEALS 11/24/15  Yes Historical Provider, MD  esomeprazole (NEXIUM) 20 MG capsule Take 20 mg by mouth daily at 12 noon.   Yes Historical Provider, MD  ferrous sulfate 325 (65 FE) MG tablet Take by mouth.   Yes Historical Provider, MD  fluticasone (FLONASE) 50 MCG/ACT  nasal spray Place 2 sprays into both nostrils daily. 01/10/16  Yes [name], MD  Fluticasone Propionate, Inhal, 250 MCG/BLIST AEPB Inhale 1 puff into the lungs 2 (two) times daily. 06/11/16  Yes Glean Hess, MD  furosemide (LASIX) 40 MG tablet Take 40 mg by mouth daily.   Yes Historical Provider, MD  glucose blood test strip Use as  instructed 03/01/16  Yes [name], MD  insulin aspart (NOVOLOG FLEXPEN) 100 UNIT/ML FlexPen Inject 16 Units into the skin 3 (three) times daily with meals.    Yes Historical Provider, MD  LANTUS SOLOSTAR 100 UNIT/ML Solostar Pen Inject 32 Units into the skin at bedtime. 04/22/16  Yes [name], MD  leflunomide (ARAVA) 10 MG tablet Take 10 mg by mouth daily. 09/02/15  Yes Historical Provider, MD  lisinopril (PRINIVIL,ZESTRIL) 40 MG tablet Take 1 tablet (40 mg total) by mouth daily. 01/10/16  Yes [name], MD  metFORMIN (GLUCOPHAGE) 500 MG tablet Take 1 tablet (500 mg total) by mouth 2 (two) times daily with a meal. 12/19/15  Yes [name], MD  Omega-3 Fatty Acids (FISH OIL) 1000 MG CAPS Take by mouth.   Yes Historical Provider, MD  PATADAY 0.2 % SOLN instill 1 drop into both eyes once daily for itching 12/02/15  Yes Historical Provider, MD  pregabalin (LYRICA) 100 MG capsule Take 1 capsule (100 mg total) by mouth 3 (three) times daily. 03/21/16  Yes [name], MD  traMADol (ULTRAM) 50 MG tablet Take 1 tablet (50 mg total) by mouth every 6 (six) hours as needed. Reported on 11/17/2015 04/20/16  Yes [name], MD  Vitamin D, Ergocalciferol, (DRISDOL) 50000 units CAPS capsule Take 1 capsule by mouth once a week.    Historical Provider, MD    No Known Allergies  Past Surgical History:  Procedure Laterality Date  . ABDOMINAL HYSTERECTOMY    . CHOLECYSTECTOMY    . COLONOSCOPY  2014   inconclusive  . COLONOSCOPY WITH PROPOFOL N/A 11/25/2015   Procedure: COLONOSCOPY WITH PROPOFOL;  Surgeon: Lucilla Lame, MD;  Location: [facility];  Service: Endoscopy;  Laterality: N/A;  . ESOPHAGOGASTRODUODENOSCOPY (EGD) WITH PROPOFOL N/A 11/25/2015   Procedure: ESOPHAGOGASTRODUODENOSCOPY (EGD) WITH PROPOFOL;  Surgeon: Lucilla Lame, MD;  Location: [facility];  Service: Endoscopy;  Laterality: N/A;  Diabetic - insulin and oral meds Sleep apnea  . HIP SURGERY Left     Social History    Substance Use Topics  . Smoking status: Former Smoker    Quit date: 10/23/2015  . Smokeless tobacco: Never Used  . Alcohol use No     Medication list has been reviewed and updated.   Physical Exam  Constitutional: She is oriented to person, place, and time. She appears well-developed. No distress.  HENT:  Head: Normocephalic and atraumatic.  Neck: Normal range of motion. No thyromegaly present.  Cardiovascular: Normal rate, regular rhythm and normal heart sounds.   Pulmonary/Chest: Effort normal and breath sounds normal. No respiratory distress.  Musculoskeletal: She exhibits edema (1+ ankle).  Neurological: She is alert and oriented to person, place, and time. Gait (walks with cane) abnormal.  Skin: Skin is warm and dry. No rash noted.  Psychiatric: She has a normal mood and affect. Her behavior is normal. Thought content normal.  Nursing note and vitals reviewed.   BP 100/62   Pulse 80   Ht 5\' 3"  (1.6 m)   Wt 225 lb (102.1 kg)   BMI 39.86 kg/m   Assessment and Plan: 1.  Type 2 diabetes, uncontrolled, with neuropathy ([place]) Patient will call back with A1C results - LANTUS SOLOSTAR 100 UNIT/ML Solostar Pen; Inject 32 Units into the skin at bedtime.  Dispense: 15 mL; Refill: 1  2. CKD (chronic kidney disease) stage 3, GFR 30-59 ml/min Followed by Nephrology  3. Essential hypertension controlled  4. Psoriatic arthritis ([place]) Followed by Rheumatology  5. Spinal stenosis of lumbosacral region Will give one Rx of tramadol - traMADol (ULTRAM) 50 MG tablet; Take 1 tablet (50 mg total) by mouth every 6 (six) hours as needed. Reported on 11/17/2015  Dispense: 30 tablet; Refill: 0  6. Encounter for screening mammogram for breast cancer - MM DIGITAL SCREENING BILATERAL; Future  7. Need for influenza vaccination - Flu Vaccine QUAD 36+ mos IM   Halina Maidens, MD [facility]  07/27/2016

## 2016-07-30 ENCOUNTER — Encounter: Payer: Self-pay | Admitting: Internal Medicine

## 2016-07-30 ENCOUNTER — Inpatient Hospital Stay (HOSPITAL_BASED_OUTPATIENT_CLINIC_OR_DEPARTMENT_OTHER): Payer: Medicare Other | Admitting: Internal Medicine

## 2016-07-30 ENCOUNTER — Inpatient Hospital Stay: Payer: Medicare Other

## 2016-07-30 VITALS — BP 125/81 | HR 92 | Temp 95.6°F | Resp 22 | Ht 63.0 in | Wt 228.0 lb

## 2016-07-30 DIAGNOSIS — I129 Hypertensive chronic kidney disease with stage 1 through stage 4 chronic kidney disease, or unspecified chronic kidney disease: Secondary | ICD-10-CM | POA: Diagnosis not present

## 2016-07-30 DIAGNOSIS — Z7984 Long term (current) use of oral hypoglycemic drugs: Secondary | ICD-10-CM

## 2016-07-30 DIAGNOSIS — G8929 Other chronic pain: Secondary | ICD-10-CM

## 2016-07-30 DIAGNOSIS — K219 Gastro-esophageal reflux disease without esophagitis: Secondary | ICD-10-CM

## 2016-07-30 DIAGNOSIS — N183 Chronic kidney disease, stage 3 unspecified: Secondary | ICD-10-CM

## 2016-07-30 DIAGNOSIS — E785 Hyperlipidemia, unspecified: Secondary | ICD-10-CM

## 2016-07-30 DIAGNOSIS — J449 Chronic obstructive pulmonary disease, unspecified: Secondary | ICD-10-CM

## 2016-07-30 DIAGNOSIS — R0609 Other forms of dyspnea: Secondary | ICD-10-CM

## 2016-07-30 DIAGNOSIS — R5383 Other fatigue: Secondary | ICD-10-CM

## 2016-07-30 DIAGNOSIS — D3502 Benign neoplasm of left adrenal gland: Secondary | ICD-10-CM

## 2016-07-30 DIAGNOSIS — E1122 Type 2 diabetes mellitus with diabetic chronic kidney disease: Secondary | ICD-10-CM

## 2016-07-30 DIAGNOSIS — D631 Anemia in chronic kidney disease: Secondary | ICD-10-CM | POA: Diagnosis not present

## 2016-07-30 DIAGNOSIS — D509 Iron deficiency anemia, unspecified: Secondary | ICD-10-CM

## 2016-07-30 DIAGNOSIS — Z794 Long term (current) use of insulin: Secondary | ICD-10-CM | POA: Diagnosis not present

## 2016-07-30 DIAGNOSIS — G4730 Sleep apnea, unspecified: Secondary | ICD-10-CM

## 2016-07-30 DIAGNOSIS — Z87891 Personal history of nicotine dependence: Secondary | ICD-10-CM

## 2016-07-30 DIAGNOSIS — D508 Other iron deficiency anemias: Secondary | ICD-10-CM

## 2016-07-30 DIAGNOSIS — M549 Dorsalgia, unspecified: Secondary | ICD-10-CM

## 2016-07-30 DIAGNOSIS — Z79899 Other long term (current) drug therapy: Secondary | ICD-10-CM

## 2016-07-30 NOTE — Assessment & Plan Note (Addendum)
Iron deficiency anemia/the context of chronic kidney disease. Negative GI workup. Patient to iron once a day. Recommend IV Venofer today. Saturation 6%.   # 6 months/labs- iron-ferritin/ IV venofer q 6 M 

## 2016-07-30 NOTE — Progress Notes (Signed)
[name] NOTE  Patient Care Team: [name], MD as PCP - General (Family Medicine) Cammie Sickle, MD as Consulting Physician (Oncology) Lucilla Lame, MD as Consulting Physician (Gastroenterology) Abby Percell Locus, MD as Consulting Physician (Endocrinology) Marlowe Sax, MD as Referring Physician (Rheumatology) Anabel Bene, MD as Referring Physician (Neurology) Sharlotte Alamo, DPM (Podiatry) Dagoberto Ligas, MD as Referring Physician (Nephrology)  CHIEF COMPLAINTS/PURPOSE OF CONSULTATION:   # DEC 2016- ANEMIA IRON DEF [place] P[facility] studies-LOW; Hb-7.27 Nov 2015- EGD/[place]-Dr.Wohl- NEG]on PO iron; June 2017- IV venoferx4  # 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  female with above history of CKD- secondary to diabetes hypertension- and also history of iron deficiency anemia is here for follow-up.  The patient status post IV iron significant improvement of her fatigue. However continues to feel fatigued; short of breath with exertion. Denies any blood in stools black red stools.  ROS: A complete 10 point review of system is done which is negative except mentioned above in history of present illness. Patient has mild swelling in the legs chronic.  MEDICAL HISTORY:  Past Medical History:  Diagnosis Date  . Allergy   . Anemia   . Arthritis    psoriatic  . Chronic kidney disease    stage 3  . COPD (chronic obstructive pulmonary disease) ([place])   . Diabetes mellitus without complication ([place])   . GERD (gastroesophageal reflux disease)   . Hip pain   . Hyperlipidemia   . Hypertension   . Sleep apnea 11/18/15   new Dx  . Spinal stenosis    lower back    SURGICAL HISTORY: Past Surgical History:  Procedure Laterality Date  . ABDOMINAL HYSTERECTOMY    . CHOLECYSTECTOMY    . COLONOSCOPY  2014   inconclusive  . COLONOSCOPY  WITH PROPOFOL N/A 11/25/2015   Procedure: COLONOSCOPY WITH PROPOFOL;  Surgeon: Lucilla Lame, MD;  Location: [facility];  Service: Endoscopy;  Laterality: N/A;  . ESOPHAGOGASTRODUODENOSCOPY (EGD) WITH PROPOFOL N/A 11/25/2015   Procedure: ESOPHAGOGASTRODUODENOSCOPY (EGD) WITH PROPOFOL;  Surgeon: Lucilla Lame, MD;  Location: [facility];  Service: Endoscopy;  Laterality: N/A;  Diabetic - insulin and oral meds Sleep apnea  . HIP SURGERY Left     SOCIAL HISTORY: Social History   Social History  . Marital status: Single    Spouse name: N/A  . Number of children: N/A  . Years of education: N/A   Occupational History  . Not on file.   Social History Main Topics  . Smoking status: Former Smoker    Quit date: 10/23/2015  . Smokeless tobacco: Never Used  . Alcohol use No  . Drug use: No  . Sexual activity: Not Currently   Other Topics Concern  . Not on file   Social History Narrative  . No narrative on file    FAMILY HISTORY: Family History  Problem Relation Age of Onset  . Diabetes Father     ALLERGIES:  has No Known Allergies.  MEDICATIONS:  Current Outpatient Prescriptions  Medication Sig Dispense Refill  . Adalimumab 40 MG/0.8ML PNKT     . albuterol (PROVENTIL HFA;VENTOLIN HFA) 108 (90 Base) MCG/ACT inhaler Inhale 2 puffs into the lungs every 6 (six) hours as needed for wheezing or shortness of breath. 18 g 2  . amLODipine (NORVASC) 5 MG tablet   0  . atorvastatin (LIPITOR) 40 MG  tablet Take 40 mg by mouth every evening. for cholesterol  0  . BD PEN NEEDLE NANO U/F 32G X 4 MM MISC USE THREE TIMES A DAY WITH MEALS  0  . esomeprazole (NEXIUM) 20 MG capsule Take 20 mg by mouth daily at 12 noon.    . ferrous sulfate 325 (65 FE) MG tablet Take by mouth.    . fluticasone (FLONASE) 50 MCG/ACT nasal spray Place 2 sprays into both nostrils daily. 1 g 5  . Fluticasone Propionate, Inhal, 250 MCG/BLIST AEPB Inhale 1 puff into the lungs 2 (two) times daily. 60 each 3  .  furosemide (LASIX) 40 MG tablet Take 40 mg by mouth daily.    Marland Kitchen glucose blood test strip Use as instructed 100 each 12  . insulin aspart (NOVOLOG FLEXPEN) 100 UNIT/ML FlexPen Inject 16 Units into the skin 3 (three) times daily with meals.     Marland Kitchen LANTUS SOLOSTAR 100 UNIT/ML Solostar Pen Inject 32 Units into the skin at bedtime. 15 mL 1  . leflunomide (ARAVA) 10 MG tablet Take 10 mg by mouth daily.  0  . lisinopril (PRINIVIL,ZESTRIL) 40 MG tablet Take 1 tablet (40 mg total) by mouth daily. 90 tablet 3  . metFORMIN (GLUCOPHAGE) 500 MG tablet Take 1 tablet (500 mg total) by mouth 2 (two) times daily with a meal. 180 tablet 3  . Omega-3 Fatty Acids (FISH OIL) 1000 MG CAPS Take by mouth.    Marland Kitchen PATADAY 0.2 % SOLN instill 1 drop into both eyes once daily for itching  0  . pregabalin (LYRICA) 100 MG capsule Take 1 capsule (100 mg total) by mouth 3 (three) times daily. 90 capsule 5  . traMADol (ULTRAM) 50 MG tablet Take 1 tablet (50 mg total) by mouth every 6 (six) hours as needed. Reported on 11/17/2015 30 tablet 0  . Vitamin D, Ergocalciferol, (DRISDOL) 50000 units CAPS capsule Take 1 capsule by mouth once a week.     No current facility-administered medications for this visit.       Marland Kitchen  PHYSICAL EXAMINATION:  Vitals:   07/30/16 1049  BP: 125/81  Pulse: 92  Resp: (!) 22  Temp: (!) 95.6 F (35.3 C)   Filed Weights   07/30/16 1049  Weight: 228 lb (103.4 kg)    GENERAL: Well-nourished well-developed; Alert, no distress and comfortable.  Alone. Obese. Walks with a cane. EYES: no pallor or icterus OROPHARYNX: no thrush or ulceration; good dentition  NECK: supple, no masses felt LYMPH:  no palpable lymphadenopathy in the cervical, axillary or inguinal regions LUNGS: clear to auscultation and  No wheeze or crackles HEART/CVS: regular rate & rhythm and no murmurs; 1+ bilateral lower extremity edema ABDOMEN: abdomen soft, non-tender and normal bowel sounds Musculoskeletal:no cyanosis of digits  and no clubbing  PSYCH: alert & oriented x 3 with fluent speech NEURO: no focal motor/sensory deficits SKIN:  no rashes or significant lesions  LABORATORY DATA:  I have reviewed the data as listed Lab Results  Component Value Date   WBC 6.7 07/23/2016   HGB 11.0 (L) 07/23/2016   HCT 33.4 (L) 07/23/2016   MCV 81.0 07/23/2016   PLT 332 07/23/2016    Recent Labs  09/28/15 1138 11/17/15 1059 12/27/15 0945 01/09/16 1518 07/23/16 1030  NA 139 143  --  143 139  K 4.7 4.5  --  4.4 3.9  CL 100 100  --  100 104  CO2 27 23  --  26 27  GLUCOSE 101*  22*  --  166* 65  BUN 15 22  --  24 23*  CREATININE 1.27* 1.42* 1.06* 1.37* 1.79*  CALCIUM 8.3* 8.8  --  8.5* 8.7*  GFRNONAA 47* 41* 57* 43* 30*  GFRAA 54* 47* >60 49* 35*  PROT 6.7  --   --   --  7.2  ALBUMIN 3.8  --   --   --  3.6  AST 14  --   --   --  18  ALT 11  --   --   --  16  ALKPHOS 113  --   --   --  99  BILITOT <0.2  --   --   --  0.3    RADIOGRAPHIC STUDIES: I have personally reviewed the radiological images as listed and agreed with the findings in the report. No results found.  ASSESSMENT & PLAN:  Dietary iron deficiency anemia Iron deficiency anemia/the context of chronic kidney disease. Negative GI workup. Patient to iron once a day. Recommend IV Venofer today. Saturation 6%.   # 6 months/labs- iron-ferritin/ IV venofer q 6 M     Cammie Sickle, MD 08/01/2016 8:02 AM

## 2016-08-03 DIAGNOSIS — E113293 Type 2 diabetes mellitus with mild nonproliferative diabetic retinopathy without macular edema, bilateral: Secondary | ICD-10-CM | POA: Diagnosis not present

## 2016-08-07 ENCOUNTER — Other Ambulatory Visit: Payer: Self-pay | Admitting: *Deleted

## 2016-08-07 ENCOUNTER — Ambulatory Visit
Admission: RE | Admit: 2016-08-07 | Discharge: 2016-08-07 | Disposition: A | Discharge status: Home / Self Care | Payer: Self-pay | Source: Ambulatory Visit | Attending: *Deleted | Admitting: *Deleted

## 2016-08-07 ENCOUNTER — Inpatient Hospital Stay: Payer: Medicare Other

## 2016-08-07 VITALS — BP 149/82 | HR 89 | Temp 97.1°F | Resp 18

## 2016-08-07 DIAGNOSIS — I129 Hypertensive chronic kidney disease with stage 1 through stage 4 chronic kidney disease, or unspecified chronic kidney disease: Secondary | ICD-10-CM | POA: Diagnosis not present

## 2016-08-07 DIAGNOSIS — N183 Chronic kidney disease, stage 3 unspecified: Secondary | ICD-10-CM

## 2016-08-07 DIAGNOSIS — Z9289 Personal history of other medical treatment: Secondary | ICD-10-CM

## 2016-08-07 DIAGNOSIS — E1122 Type 2 diabetes mellitus with diabetic chronic kidney disease: Secondary | ICD-10-CM | POA: Diagnosis not present

## 2016-08-07 DIAGNOSIS — D631 Anemia in chronic kidney disease: Secondary | ICD-10-CM | POA: Diagnosis not present

## 2016-08-07 DIAGNOSIS — D509 Iron deficiency anemia, unspecified: Secondary | ICD-10-CM

## 2016-08-07 DIAGNOSIS — Z794 Long term (current) use of insulin: Secondary | ICD-10-CM | POA: Diagnosis not present

## 2016-08-07 MED ORDER — SODIUM CHLORIDE 0.9 % IV SOLN: 200.0000 mg | Freq: Once | INTRAVENOUS | Status: DC

## 2016-08-07 MED ORDER — IRON SUCROSE 20 MG/ML IV SOLN
200.0000 mg | Freq: Once | INTRAVENOUS | Status: AC
  Administered 2016-08-07: 200 mg via INTRAVENOUS
  Filled 2016-08-07: qty 10

## 2016-08-07 MED ORDER — SODIUM CHLORIDE 0.9 % IV SOLN
Freq: Once | INTRAVENOUS | Status: AC
  Administered 2016-08-07: 14:00:00 via INTRAVENOUS
  Filled 2016-08-07: qty 1000

## 2016-08-15 ENCOUNTER — Ambulatory Visit
Admission: RE | Admit: 2016-08-15 | Discharge: 2016-08-15 | Disposition: A | Discharge status: Home / Self Care | Payer: Medicare Other | Source: Ambulatory Visit | Attending: Internal Medicine | Admitting: Internal Medicine

## 2016-08-15 DIAGNOSIS — Z1231 Encounter for screening mammogram for malignant neoplasm of breast: Secondary | ICD-10-CM | POA: Insufficient documentation

## 2016-08-17 ENCOUNTER — Other Ambulatory Visit: Payer: Self-pay | Admitting: Family Medicine

## 2016-08-21 ENCOUNTER — Other Ambulatory Visit: Payer: Self-pay

## 2016-08-22 DIAGNOSIS — K219 Gastro-esophageal reflux disease without esophagitis: Secondary | ICD-10-CM | POA: Diagnosis not present

## 2016-08-22 DIAGNOSIS — Z720 Tobacco use: Secondary | ICD-10-CM | POA: Diagnosis not present

## 2016-08-22 DIAGNOSIS — R5383 Other fatigue: Secondary | ICD-10-CM | POA: Diagnosis not present

## 2016-08-22 DIAGNOSIS — Z5181 Encounter for therapeutic drug level monitoring: Secondary | ICD-10-CM | POA: Diagnosis not present

## 2016-08-22 DIAGNOSIS — Z01812 Encounter for preprocedural laboratory examination: Secondary | ICD-10-CM | POA: Diagnosis not present

## 2016-08-22 DIAGNOSIS — R5381 Other malaise: Secondary | ICD-10-CM | POA: Diagnosis not present

## 2016-08-22 DIAGNOSIS — R638 Other symptoms and signs concerning food and fluid intake: Secondary | ICD-10-CM | POA: Diagnosis not present

## 2016-08-22 DIAGNOSIS — G4733 Obstructive sleep apnea (adult) (pediatric): Secondary | ICD-10-CM | POA: Diagnosis not present

## 2016-08-22 DIAGNOSIS — Z9911 Dependence on respirator [ventilator] status: Secondary | ICD-10-CM | POA: Diagnosis not present

## 2016-08-22 DIAGNOSIS — N08 Glomerular disorders in diseases classified elsewhere: Secondary | ICD-10-CM | POA: Diagnosis not present

## 2016-08-22 DIAGNOSIS — E119 Type 2 diabetes mellitus without complications: Secondary | ICD-10-CM | POA: Diagnosis not present

## 2016-08-27 ENCOUNTER — Other Ambulatory Visit: Payer: Self-pay | Admitting: Internal Medicine

## 2016-08-27 DIAGNOSIS — L4050 Arthropathic psoriasis, unspecified: Secondary | ICD-10-CM | POA: Diagnosis not present

## 2016-08-27 DIAGNOSIS — L409 Psoriasis, unspecified: Secondary | ICD-10-CM | POA: Diagnosis not present

## 2016-08-27 DIAGNOSIS — Z79899 Other long term (current) drug therapy: Secondary | ICD-10-CM | POA: Diagnosis not present

## 2016-08-27 DIAGNOSIS — M1A00X0 Idiopathic chronic gout, unspecified site, without tophus (tophi): Secondary | ICD-10-CM | POA: Insufficient documentation

## 2016-08-27 DIAGNOSIS — N184 Chronic kidney disease, stage 4 (severe): Secondary | ICD-10-CM | POA: Diagnosis not present

## 2016-08-30 ENCOUNTER — Other Ambulatory Visit: Payer: Self-pay | Admitting: Internal Medicine

## 2016-08-30 MED ORDER — PREGABALIN 100 MG PO CAPS: 100.0000 mg | ORAL_CAPSULE | Freq: Three times a day (TID) | ORAL | 0 refills | Status: DC

## 2016-09-06 ENCOUNTER — Encounter: Payer: Self-pay | Admitting: Cardiology

## 2016-09-06 ENCOUNTER — Ambulatory Visit (INDEPENDENT_AMBULATORY_CARE_PROVIDER_SITE_OTHER): Payer: Medicare Other | Admitting: Cardiology

## 2016-09-06 VITALS — BP 120/68 | HR 86 | Ht 63.0 in | Wt 227.5 lb

## 2016-09-06 DIAGNOSIS — R0602 Shortness of breath: Secondary | ICD-10-CM | POA: Diagnosis not present

## 2016-09-06 DIAGNOSIS — E785 Hyperlipidemia, unspecified: Secondary | ICD-10-CM

## 2016-09-06 DIAGNOSIS — I10 Essential (primary) hypertension: Secondary | ICD-10-CM | POA: Diagnosis not present

## 2016-09-06 DIAGNOSIS — Z5181 Encounter for therapeutic drug level monitoring: Secondary | ICD-10-CM | POA: Diagnosis not present

## 2016-09-06 DIAGNOSIS — IMO0001 Reserved for inherently not codable concepts without codable children: Secondary | ICD-10-CM

## 2016-09-06 DIAGNOSIS — Z01818 Encounter for other preprocedural examination: Secondary | ICD-10-CM | POA: Diagnosis not present

## 2016-09-06 DIAGNOSIS — R9431 Abnormal electrocardiogram [ECG] [EKG]: Secondary | ICD-10-CM

## 2016-09-06 DIAGNOSIS — R011 Cardiac murmur, unspecified: Secondary | ICD-10-CM

## 2016-09-06 DIAGNOSIS — E6609 Other obesity due to excess calories: Secondary | ICD-10-CM

## 2016-09-06 DIAGNOSIS — Z6841 Body Mass Index (BMI) 40.0 and over, adult: Secondary | ICD-10-CM

## 2016-09-06 NOTE — Progress Notes (Signed)
Cardiology Office Note   Date:  09/06/2016   ID:  Kathleen Lee, DOB 1956-03-27, MRN [id number]  Referring Doctor:  [name], MD   Cardiologist:   Wende Bushy, MD   Reason for consultation:  Chief Complaint  Patient presents with  . other    New Patient. surgical clearance. Pt has cold but otherwise doing well. Reviewed meds with pt verbally.      History of Present Illness: Kathleen Lee is a 60 y.o. female who presents for Preoperative cardiac evaluation prior to bariatric surgery  Patient has shortness of breath. This has been going on for quite some time now. She thinks it is from her weight. She developed shortness of breath with minimal exertion, lifting laundry, walking, associated with being emotionally upset. Patient denies chest pain. No palpitations. Shortness of breath was on with exertion, goes away with rest. Nonradiating.  As mentioned, no chest pain, palpitations, no loss of consciousness. No PND, orthopnea, edema.   ROS:  Please see the history of present illness. Aside from mentioned under HPI, all other systems are reviewed and negative.     Past Medical History:  Diagnosis Date  . Allergy   . Anemia   . Arthritis    psoriatic  . Chronic kidney disease    stage 3  . COPD (chronic obstructive pulmonary disease) ([place])   . Diabetes mellitus without complication ([place])   . GERD (gastroesophageal reflux disease)   . Hip pain   . Hyperlipidemia   . Hypertension   . Sleep apnea 11/18/15   new Dx  . Spinal stenosis    lower back    Past Surgical History:  Procedure Laterality Date  . ABDOMINAL HYSTERECTOMY    . CHOLECYSTECTOMY    . COLONOSCOPY  2014   inconclusive  . COLONOSCOPY WITH PROPOFOL N/A 11/25/2015   Procedure: COLONOSCOPY WITH PROPOFOL;  Surgeon: Lucilla Lame, MD;  Location: [facility];  Service: Endoscopy;  Laterality: N/A;  . ESOPHAGOGASTRODUODENOSCOPY (EGD) WITH PROPOFOL N/A 11/25/2015   Procedure:  ESOPHAGOGASTRODUODENOSCOPY (EGD) WITH PROPOFOL;  Surgeon: Lucilla Lame, MD;  Location: [facility];  Service: Endoscopy;  Laterality: N/A;  Diabetic - insulin and oral meds Sleep apnea  . HIP SURGERY Left      reports that she quit smoking about 10 months ago. She has never used smokeless tobacco. She reports that she does not drink alcohol or use drugs.   family history includes Alzheimer's disease in her mother; Diabetes in her father and sister. No known history of CAD or SCD in the family  Outpatient Medications Prior to Visit  Medication Sig Dispense Refill  . Adalimumab 40 MG/0.8ML PNKT     . albuterol (PROVENTIL HFA;VENTOLIN HFA) 108 (90 Base) MCG/ACT inhaler Inhale 2 puffs into the lungs every 6 (six) hours as needed for wheezing or shortness of breath. 18 g 2  . amLODipine (NORVASC) 5 MG tablet   0  . atorvastatin (LIPITOR) 40 MG tablet take 1 tablet by mouth every evening for cholesterol 90 tablet 0  . BD PEN NEEDLE NANO U/F 32G X 4 MM MISC USE THREE TIMES A DAY WITH MEALS  0  . esomeprazole (NEXIUM) 20 MG capsule Take 20 mg by mouth daily at 12 noon.    . ferrous sulfate 325 (65 FE) MG tablet Take by mouth.    . fluticasone (FLONASE) 50 MCG/ACT nasal spray Place 2 sprays into both nostrils daily. 1 g 5  . Fluticasone Propionate, Inhal, 250 MCG/BLIST  AEPB Inhale 1 puff into the lungs 2 (two) times daily. 60 each 3  . furosemide (LASIX) 40 MG tablet Take 40 mg by mouth daily.    Marland Kitchen glucose blood test strip Use as instructed 100 each 12  . LANTUS SOLOSTAR 100 UNIT/ML Solostar Pen Inject 32 Units into the skin at bedtime. 15 mL 1  . lisinopril (PRINIVIL,ZESTRIL) 40 MG tablet Take 1 tablet (40 mg total) by mouth daily. 90 tablet 3  . metFORMIN (GLUCOPHAGE) 500 MG tablet Take 1 tablet (500 mg total) by mouth 2 (two) times daily with a meal. 180 tablet 3  . NOVOLOG FLEXPEN 100 UNIT/ML FlexPen inject 16 units subcutaneously with food 15 pen 1  . Omega-3 Fatty Acids (FISH OIL) 1000  MG CAPS Take by mouth.    Marland Kitchen PATADAY 0.2 % SOLN instill 1 drop into both eyes once daily for itching  0  . pregabalin (LYRICA) 100 MG capsule Take 1 capsule (100 mg total) by mouth 3 (three) times daily. 90 capsule 0  . traMADol (ULTRAM) 50 MG tablet Take 1 tablet (50 mg total) by mouth every 6 (six) hours as needed. Reported on 11/17/2015 30 tablet 0  . Vitamin D, Ergocalciferol, (DRISDOL) 50000 units CAPS capsule Take 1 capsule by mouth once a week.    . leflunomide (ARAVA) 10 MG tablet Take 10 mg by mouth daily.  0   No facility-administered medications prior to visit.      Allergies: Patient has no known allergies.    PHYSICAL EXAM: VS:  BP 120/68 (BP Location: Right Arm, Patient Position: Sitting, Cuff Size: Large)   Pulse 86   Ht 5\' 3"  (1.6 m)   Wt 227 lb 8 oz (103.2 kg)   BMI 40.30 kg/m  , Body mass index is 40.3 kg/m. Wt Readings from Last 3 Encounters:  09/06/16 227 lb 8 oz (103.2 kg)  07/30/16 228 lb (103.4 kg)  07/27/16 225 lb (102.1 kg)    GENERAL:  well developed, well nourished, obese, not in acute distress HEENT: normocephalic, pink conjunctivae, anicteric sclerae, no xanthelasma, normal dentition, oropharynx clear NECK:  no neck vein engorgement, JVP normal, no hepatojugular reflux, carotid upstroke brisk and symmetric, no bruit, no thyromegaly, no lymphadenopathy LUNGS:  good respiratory effort, clear to auscultation bilaterally CV:  PMI not displaced, no thrills, no lifts, S1 and S2 within normal limits, no palpable S3 or S4, Soft systolic murmur, no rubs, no gallops ABD:  Soft, nontender, nondistended, normoactive bowel sounds, no abdominal aortic bruit, no hepatomegaly, no splenomegaly MS: nontender back, no kyphosis, no scoliosis, no joint deformities EXT:  2+ DP/PT pulses, no edema, no varicosities, no cyanosis, no clubbing SKIN: warm, nondiaphoretic, normal turgor, no ulcers NEUROPSYCH: alert, oriented to person, place, and time, sensory/motor grossly intact,  normal mood, appropriate affect  Recent Labs: 09/28/2015: TSH 1.210 07/23/2016: ALT 16; BUN 23; Creatinine, Ser 1.79; Hemoglobin 11.0; Platelets 332; Potassium 3.9; Sodium 139   Lipid Panel    Component Value Date/Time   CHOL 138 09/28/2015 1138   TRIG 71 09/28/2015 1138   HDL 52 09/28/2015 1138   CHOLHDL 2.7 09/28/2015 1138   LDLCALC 72 09/28/2015 1138     Other studies Reviewed:  EKG:  The ekg from 09/06/2016 was personally reviewed by me and it revealed Sinus rhythm, 89 BPM. Left axis deviation. Right atrial enlargement. Abnormal EKG.  Additional studies/ records that were reviewed personally reviewed by me today include: None available   ASSESSMENT AND PLAN: Shortness breath Systolic  murmur Preoperative cardiac evaluation prior to bariatric surgery Risk factors for CAD include diabetes, hypertension, hyperlipidemia, morbid obesity Recommend further evaluation with pharmacologic nuclear stress test. Recommend echocardiogram An echocardiogram is unremarkable, and no ischemia on stress test, patient is likely intermediate cardiac risk for moderate risk procedure.  Hypertension BP is well controlled. Continue monitoring BP. Continue current medical therapy and lifestyle changes.  Hyperlipidemia LDL goal is less than 70 due to history of diabetes. PCP following labs.  Obesity Body mass index is 40.3 kg/m.Marland Kitchen Recommend aggressive weight loss through diet and increased physical activity.   Current medicines are reviewed at length with the patient today.  The patient does not have concerns regarding medicines.  Labs/ tests ordered today include:  Orders Placed This Encounter  Procedures  . EKG 12-Lead    I had a lengthy and detailed discussion with the patient regarding diagnoses, prognosis, diagnostic options, treatment options , and side effects of medications.   I counseled the patient on importance of lifestyle modification including heart healthy diet, regular  physical activity .   Disposition:   FU with undersigned after tests   Signed, Wende Bushy, MD  09/06/2016 9:47 AM    [place]  This note was generated in part with voice recognition software and I apologize for any typographical errors that were not detected and corrected.

## 2016-09-06 NOTE — Patient Instructions (Signed)
Testing/Procedures: Your physician has requested that you have an echocardiogram. Echocardiography is a painless test that uses sound waves to create images of your heart. It provides your doctor with information about the size and shape of your heart and how well your heart's chambers and valves are working. This procedure takes approximately one hour. There are no restrictions for this procedure.  Kathleen Lee  Your caregiver has ordered a Stress Test with nuclear imaging. The purpose of this test is to evaluate the blood supply to your heart muscle. This procedure is referred to as a "Non-Invasive Stress Test." This is because other than having an IV started in your vein, nothing is inserted or "invades" your body. Cardiac stress tests are done to find areas of poor blood flow to the heart by determining the extent of coronary artery disease (CAD). Some patients exercise on a treadmill, which naturally increases the blood flow to your heart, while others who are  unable to walk on a treadmill due to physical limitations have a pharmacologic/chemical stress agent called Lexiscan . This medicine will mimic walking on a treadmill by temporarily increasing your coronary blood flow.   Please note: these test may take anywhere between 2-4 hours to complete  PLEASE REPORT TO [place] WILL DIRECT YOU WHERE TO GO  Date of Procedure:_Friday September 14, 2016 at 07:30AM  Arrival Time for Procedure:_Arrive at 07:15AM to register___  Instructions regarding medication:   __X__ : Hold diabetes medication Novolog and Metformin (Glucophage) the morning of procedure.   __X_:  Hold Lasix (Furosemide) the morning of procedure  __X__:  Decrease Lantus the night before to 16 units  PLEASE NOTIFY THE OFFICE AT LEAST 24 HOURS IN ADVANCE IF YOU ARE UNABLE TO [place].  [phone] AND  PLEASE NOTIFY NUCLEAR MEDICINE AT [facility] AT LEAST 24 HOURS IN  ADVANCE IF YOU ARE UNABLE TO KEEP YOUR APPOINTMENT. [phone]  How to prepare for your Myoview test:  1. Do not eat or drink after midnight 2. No caffeine for 24 hours prior to test 3. No smoking 24 hours prior to test. 4. Your medication may be taken with water.  If your doctor stopped a medication because of this test, do not take that medication. 5. Ladies, please do not wear dresses.  Skirts or pants are appropriate. Please wear a short sleeve shirt. 6. No perfume, cologne or lotion. 7. Wear comfortable walking shoes. No heels!  Follow-Up: Your physician recommends that you schedule a follow-up appointment as needed with Dr. Yvone Neu. We will call you with results and if needed schedule follow up at that time.   It was a pleasure seeing you today here in the office. Please do not hesitate to give [place] a call back if you have any further questions. [place], BSN     Pharmacologic Stress Electrocardiogram A pharmacologic stress electrocardiogram is a heart (cardiac) test that uses nuclear imaging to evaluate the blood supply to your heart. This test may also be called a pharmacologic stress electrocardiography. Pharmacologic means that a medicine is used to increase your heart rate and blood pressure.  This stress test is done to find areas of poor blood flow to the heart by determining the extent of coronary artery disease (CAD). Some people exercise on a treadmill, which naturally increases the blood flow to the heart. For those people unable to exercise on a treadmill, a medicine is used. This medicine  stimulates your heart and will cause your heart to beat harder and more quickly, as if you were exercising.  Pharmacologic stress tests can help determine:  The adequacy of blood flow to your heart during increased levels of activity in order to clear you for discharge home.  The extent of coronary artery blockage caused by CAD.  Your prognosis if you have suffered a  heart attack.  The effectiveness of cardiac procedures done, such as an angioplasty, which can increase the circulation in your coronary arteries.  Causes of chest pain or pressure. LET [facility] CARE PROVIDER KNOW ABOUT:  Any allergies you have.  All medicines you are taking, including vitamins, herbs, eye drops, creams, and over-the-counter medicines.  Previous problems you or members of your family have had with the use of anesthetics.  Any blood disorders you have.  Previous surgeries you have had.  Medical conditions you have.  Possibility of pregnancy, if this applies.  If you are currently breastfeeding. RISKS AND COMPLICATIONS Generally, this is a safe procedure. However, as with any procedure, complications can occur. Possible complications include:  You develop pain or pressure in the following areas:  Chest.  Jaw or neck.  Between your shoulder blades.  Radiating down your left arm.  Headache.  Dizziness or light-headedness.  Shortness of breath.  Increased or irregular heartbeat.  Low blood pressure.  Nausea or vomiting.  Flushing.  Redness going up the arm and slight pain during injection of medicine.  Heart attack (rare). BEFORE THE PROCEDURE   Avoid all forms of caffeine for 24 hours before your test or as directed by your health care provider. This includes coffee, tea (even decaffeinated tea), caffeinated sodas, chocolate, cocoa, and certain pain medicines.  Follow your health care provider's instructions regarding eating and drinking before the test.  Take your medicines as directed at regular times with water unless instructed otherwise. Exceptions may include:  If you have diabetes, ask how you are to take your insulin or pills. It is common to adjust insulin dosing the morning of the test.  If you are taking beta-blocker medicines, it is important to talk to your health care provider about these medicines well before the date of  your test. Taking beta-blocker medicines may interfere with the test. In some cases, these medicines need to be changed or stopped 24 hours or more before the test.  If you wear a nitroglycerin patch, it may need to be removed prior to the test. Ask your health care provider if the patch should be removed before the test.  If you use an inhaler for any breathing condition, bring it with you to the test.  If you are an outpatient, bring a snack so you can eat right after the stress phase of the test.  Do not smoke for 4 hours prior to the test or as directed by your health care provider.  Do not apply lotions, powders, creams, or oils on your chest prior to the test.  Wear comfortable shoes and clothing. Let your health care provider know if you were unable to complete or follow the preparations for your test. PROCEDURE   Multiple patches (electrodes) will be put on your chest. If needed, small areas of your chest may be shaved to get better contact with the electrodes. Once the electrodes are attached to your body, multiple wires will be attached to the electrodes, and your heart rate will be monitored.  An IV access will be started. A nuclear trace (  isotope) is given. The isotope may be given intravenously, or it may be swallowed. Nuclear refers to several types of radioactive isotopes, and the nuclear isotope lights up the arteries so that the nuclear images are clear. The isotope is absorbed by your body. This results in low radiation exposure.  A resting nuclear image is taken to show how your heart functions at rest.  A medicine is given through the IV access.  A second scan is done about 1 hour after the medicine injection and determines how your heart functions under stress.  During this stress phase, you will be connected to an electrocardiogram machine. Your blood pressure and oxygen levels will be monitored. AFTER THE PROCEDURE   Your heart rate and blood pressure will be  monitored after the test.  You may return to your normal schedule, including diet,activities, and medicines, unless your health care provider tells you otherwise. This information is not intended to replace advice given to you by your health care provider. Make sure you discuss any questions you have with your health care provider. Document Released: 02/24/2009 Document Revised: 10/13/2013 Document Reviewed: 06/15/2013 Elsevier Interactive Patient Education  2017 [place]. Echocardiogram An echocardiogram, or echocardiography, uses sound waves (ultrasound) to produce an image of your heart. The echocardiogram is simple, painless, obtained within a short period of time, and offers valuable information to your health care provider. The images from an echocardiogram can provide information such as:  Evidence of coronary artery disease (CAD).  Heart size.  Heart muscle function.  Heart valve function.  Aneurysm detection.  Evidence of a past heart attack.  Fluid buildup around the heart.  Heart muscle thickening.  Assess heart valve function. Tell a health care provider about:  Any allergies you have.  All medicines you are taking, including vitamins, herbs, eye drops, creams, and over-the-counter medicines.  Any problems you or family members have had with anesthetic medicines.  Any blood disorders you have.  Any surgeries you have had.  Any medical conditions you have.  Whether you are pregnant or may be pregnant. What happens before the procedure? No special preparation is needed. Eat and drink normally. What happens during the procedure?  In order to produce an image of your heart, gel will be applied to your chest and a wand-like tool (transducer) will be moved over your chest. The gel will help transmit the sound waves from the transducer. The sound waves will harmlessly bounce off your heart to allow the heart images to be captured in real-time motion. These  images will then be recorded.  You may need an IV to receive a medicine that improves the quality of the pictures. What happens after the procedure? You may return to your normal schedule including diet, activities, and medicines, unless your health care provider tells you otherwise. This information is not intended to replace advice given to you by your health care provider. Make sure you discuss any questions you have with your health care provider. Document Released: 10/05/2000 Document Revised: 05/26/2016 Document Reviewed: 06/15/2013 Elsevier Interactive Patient Education  2017 [employer].

## 2016-09-11 DIAGNOSIS — L4059 Other psoriatic arthropathy: Secondary | ICD-10-CM | POA: Diagnosis not present

## 2016-09-12 ENCOUNTER — Telehealth: Payer: Self-pay | Admitting: Cardiology

## 2016-09-12 NOTE — Telephone Encounter (Signed)
Reviewed myoview instructions w/pt who verbalized understanding and confirms 11/24, 7:15am arrival

## 2016-09-14 ENCOUNTER — Encounter
Admission: RE | Admit: 2016-09-14 | Discharge: 2016-09-14 | Disposition: A | Discharge status: Home / Self Care | Payer: Medicare Other | Source: Ambulatory Visit | Attending: Cardiology | Admitting: Cardiology

## 2016-09-14 DIAGNOSIS — Z01818 Encounter for other preprocedural examination: Secondary | ICD-10-CM | POA: Diagnosis not present

## 2016-09-14 DIAGNOSIS — R0602 Shortness of breath: Secondary | ICD-10-CM | POA: Insufficient documentation

## 2016-09-14 DIAGNOSIS — R011 Cardiac murmur, unspecified: Secondary | ICD-10-CM | POA: Diagnosis not present

## 2016-09-14 HISTORY — DX: Disorder of kidney and ureter, unspecified: N28.9

## 2016-09-14 LAB — NM MYOCAR MULTI W/SPECT W/WALL MOTION / EF
CHL CUP MPHR: 161 {beats}/min
CHL CUP NUCLEAR SDS: 0
CHL CUP NUCLEAR SRS: 2
CSEPED: 0 min
Estimated workload: 1 METS
Exercise duration (sec): 0 s
LVDIAVOL: 104 mL (ref 46–106)
LVSYSVOL: 43 mL
NUC STRESS TID: 0.95
Peak HR: 101 {beats}/min
Percent HR: 62 %
Rest HR: 85 {beats}/min
SSS: 1

## 2016-09-14 MED ORDER — TECHNETIUM TC 99M TETROFOSMIN IV KIT
13.0000 | PACK | Freq: Once | INTRAVENOUS | Status: AC | PRN
  Administered 2016-09-14: 13.5 via INTRAVENOUS

## 2016-09-14 MED ORDER — TECHNETIUM TC 99M TETROFOSMIN IV KIT
33.0000 | PACK | Freq: Once | INTRAVENOUS | Status: AC | PRN
  Administered 2016-09-14: 32.512 via INTRAVENOUS

## 2016-09-14 MED ORDER — REGADENOSON 0.4 MG/5ML IV SOLN
0.4000 mg | Freq: Once | INTRAVENOUS | Status: AC
  Administered 2016-09-14: 0.4 mg via INTRAVENOUS

## 2016-09-17 DIAGNOSIS — G8929 Other chronic pain: Secondary | ICD-10-CM | POA: Diagnosis not present

## 2016-09-17 DIAGNOSIS — M1A00X0 Idiopathic chronic gout, unspecified site, without tophus (tophi): Secondary | ICD-10-CM | POA: Diagnosis not present

## 2016-09-17 DIAGNOSIS — L4050 Arthropathic psoriasis, unspecified: Secondary | ICD-10-CM | POA: Diagnosis not present

## 2016-09-17 DIAGNOSIS — L409 Psoriasis, unspecified: Secondary | ICD-10-CM | POA: Diagnosis not present

## 2016-09-17 DIAGNOSIS — M179 Osteoarthritis of knee, unspecified: Secondary | ICD-10-CM | POA: Diagnosis not present

## 2016-09-17 DIAGNOSIS — E1140 Type 2 diabetes mellitus with diabetic neuropathy, unspecified: Secondary | ICD-10-CM | POA: Diagnosis not present

## 2016-09-17 DIAGNOSIS — Z794 Long term (current) use of insulin: Secondary | ICD-10-CM | POA: Diagnosis not present

## 2016-09-17 DIAGNOSIS — M25561 Pain in right knee: Secondary | ICD-10-CM

## 2016-09-17 DIAGNOSIS — Z79899 Other long term (current) drug therapy: Secondary | ICD-10-CM | POA: Diagnosis not present

## 2016-09-17 DIAGNOSIS — L851 Acquired keratosis [keratoderma] palmaris et plantaris: Secondary | ICD-10-CM | POA: Diagnosis not present

## 2016-09-17 DIAGNOSIS — B351 Tinea unguium: Secondary | ICD-10-CM | POA: Diagnosis not present

## 2016-09-17 DIAGNOSIS — N183 Chronic kidney disease, stage 3 (moderate): Secondary | ICD-10-CM | POA: Diagnosis not present

## 2016-09-18 ENCOUNTER — Other Ambulatory Visit: Payer: Self-pay | Admitting: Bariatrics

## 2016-09-18 ENCOUNTER — Ambulatory Visit
Admission: RE | Admit: 2016-09-18 | Discharge: 2016-09-18 | Disposition: A | Discharge status: Home / Self Care | Payer: Medicare Other | Source: Ambulatory Visit | Attending: Bariatrics | Admitting: Bariatrics

## 2016-09-18 DIAGNOSIS — Z01818 Encounter for other preprocedural examination: Secondary | ICD-10-CM | POA: Diagnosis not present

## 2016-09-19 ENCOUNTER — Other Ambulatory Visit: Payer: Self-pay

## 2016-09-20 DIAGNOSIS — F4322 Adjustment disorder with anxiety: Secondary | ICD-10-CM | POA: Diagnosis not present

## 2016-09-27 ENCOUNTER — Encounter: Payer: Medicare Other | Admitting: Cardiology

## 2016-10-01 ENCOUNTER — Ambulatory Visit (INDEPENDENT_AMBULATORY_CARE_PROVIDER_SITE_OTHER): Payer: Medicare Other

## 2016-10-01 ENCOUNTER — Other Ambulatory Visit: Payer: Self-pay

## 2016-10-01 DIAGNOSIS — R011 Cardiac murmur, unspecified: Secondary | ICD-10-CM

## 2016-10-01 DIAGNOSIS — Z01818 Encounter for other preprocedural examination: Secondary | ICD-10-CM

## 2016-10-01 DIAGNOSIS — F4322 Adjustment disorder with anxiety: Secondary | ICD-10-CM | POA: Diagnosis not present

## 2016-10-01 DIAGNOSIS — R0602 Shortness of breath: Secondary | ICD-10-CM | POA: Diagnosis not present

## 2016-10-02 DIAGNOSIS — L4059 Other psoriatic arthropathy: Secondary | ICD-10-CM | POA: Diagnosis not present

## 2016-10-03 DIAGNOSIS — G4733 Obstructive sleep apnea (adult) (pediatric): Secondary | ICD-10-CM | POA: Diagnosis not present

## 2016-10-03 DIAGNOSIS — N08 Glomerular disorders in diseases classified elsewhere: Secondary | ICD-10-CM | POA: Diagnosis not present

## 2016-10-03 DIAGNOSIS — K219 Gastro-esophageal reflux disease without esophagitis: Secondary | ICD-10-CM | POA: Diagnosis not present

## 2016-10-03 DIAGNOSIS — E119 Type 2 diabetes mellitus without complications: Secondary | ICD-10-CM | POA: Diagnosis not present

## 2016-10-03 DIAGNOSIS — N289 Disorder of kidney and ureter, unspecified: Secondary | ICD-10-CM | POA: Diagnosis not present

## 2016-10-03 DIAGNOSIS — E611 Iron deficiency: Secondary | ICD-10-CM | POA: Diagnosis not present

## 2016-10-05 ENCOUNTER — Telehealth: Payer: Self-pay

## 2016-10-05 NOTE — Telephone Encounter (Signed)
Dr. Army Melia saw in October but I can see today or Monday.

## 2016-10-05 NOTE — Telephone Encounter (Signed)
Patient was on for Jan 9 to see you and had to cancel. She has to move out of [place] for emergency. She was very upset crying and asked if we can refill her meds including Lyrica. I advised she needs appt and she said she leaves Tuesday.

## 2016-10-08 ENCOUNTER — Ambulatory Visit (INDEPENDENT_AMBULATORY_CARE_PROVIDER_SITE_OTHER): Payer: Medicare Other | Admitting: Family Medicine

## 2016-10-08 ENCOUNTER — Encounter: Payer: Self-pay | Admitting: Family Medicine

## 2016-10-08 VITALS — BP 122/82 | HR 81 | Resp 16 | Ht 63.0 in | Wt 224.0 lb

## 2016-10-08 DIAGNOSIS — E785 Hyperlipidemia, unspecified: Secondary | ICD-10-CM

## 2016-10-08 DIAGNOSIS — IMO0002 Reserved for concepts with insufficient information to code with codable children: Secondary | ICD-10-CM

## 2016-10-08 DIAGNOSIS — E1165 Type 2 diabetes mellitus with hyperglycemia: Secondary | ICD-10-CM | POA: Diagnosis not present

## 2016-10-08 DIAGNOSIS — E559 Vitamin D deficiency, unspecified: Secondary | ICD-10-CM | POA: Diagnosis not present

## 2016-10-08 DIAGNOSIS — N183 Chronic kidney disease, stage 3 unspecified: Secondary | ICD-10-CM

## 2016-10-08 DIAGNOSIS — M4807 Spinal stenosis, lumbosacral region: Secondary | ICD-10-CM | POA: Diagnosis not present

## 2016-10-08 DIAGNOSIS — L4050 Arthropathic psoriasis, unspecified: Secondary | ICD-10-CM | POA: Diagnosis not present

## 2016-10-08 DIAGNOSIS — J449 Chronic obstructive pulmonary disease, unspecified: Secondary | ICD-10-CM | POA: Diagnosis not present

## 2016-10-08 DIAGNOSIS — I10 Essential (primary) hypertension: Secondary | ICD-10-CM | POA: Diagnosis not present

## 2016-10-08 DIAGNOSIS — J309 Allergic rhinitis, unspecified: Secondary | ICD-10-CM | POA: Diagnosis not present

## 2016-10-08 DIAGNOSIS — E1140 Type 2 diabetes mellitus with diabetic neuropathy, unspecified: Secondary | ICD-10-CM | POA: Diagnosis not present

## 2016-10-08 MED ORDER — FUROSEMIDE 40 MG PO TABS: 40.0000 mg | ORAL_TABLET | Freq: Every day | ORAL | 3 refills | Status: AC

## 2016-10-08 MED ORDER — FLUTICASONE PROPIONATE 50 MCG/ACT NA SUSP: 2.0000 | Freq: Every day | NASAL | 3 refills | Status: AC

## 2016-10-08 MED ORDER — ALBUTEROL SULFATE HFA 108 (90 BASE) MCG/ACT IN AERS: 2.0000 | INHALATION_SPRAY | Freq: Four times a day (QID) | RESPIRATORY_TRACT | 0 refills | Status: AC | PRN

## 2016-10-08 MED ORDER — PREGABALIN 100 MG PO CAPS: 100.0000 mg | ORAL_CAPSULE | Freq: Three times a day (TID) | ORAL | 5 refills | Status: AC

## 2016-10-08 MED ORDER — LANTUS SOLOSTAR 100 UNIT/ML ~~LOC~~ SOPN: 32.0000 [IU] | PEN_INJECTOR | Freq: Every day | SUBCUTANEOUS | 1 refills | Status: AC

## 2016-10-08 MED ORDER — ATORVASTATIN CALCIUM 40 MG PO TABS: 40.0000 mg | ORAL_TABLET | Freq: Every evening | ORAL | 3 refills | Status: AC

## 2016-10-08 MED ORDER — FLUTICASONE PROPIONATE (INHAL) 250 MCG/BLIST IN AEPB: 1.0000 | INHALATION_SPRAY | Freq: Two times a day (BID) | RESPIRATORY_TRACT | 5 refills | Status: AC

## 2016-10-08 MED ORDER — METFORMIN HCL 500 MG PO TABS: 500.0000 mg | ORAL_TABLET | Freq: Two times a day (BID) | ORAL | 3 refills | Status: AC

## 2016-10-08 MED ORDER — LISINOPRIL 40 MG PO TABS: 40.0000 mg | ORAL_TABLET | Freq: Every day | ORAL | 3 refills | Status: AC

## 2016-10-08 MED ORDER — TRAMADOL HCL 50 MG PO TABS: 50.0000 mg | ORAL_TABLET | Freq: Four times a day (QID) | ORAL | 0 refills | Status: AC | PRN

## 2016-10-08 MED ORDER — AMLODIPINE BESYLATE 5 MG PO TABS: 5.0000 mg | ORAL_TABLET | Freq: Every day | ORAL | 3 refills | Status: AC

## 2016-10-08 MED ORDER — INSULIN ASPART 100 UNIT/ML FLEXPEN: PEN_INJECTOR | SUBCUTANEOUS | 1 refills | Status: AC

## 2016-10-08 NOTE — Progress Notes (Signed)
Date:  10/08/2016   Name:  Kathleen Lee   DOB:  08-Jul-1956   MRN:  [id number]  PCP:  [name], MD    Chief Complaint: Arthritis (Needs Lyrica refill-moving Wed out of state. ) and Diabetes (Needs refills )   History of Present Illness:  This is a 60 y.o. female who is moving permanently to [place] this week, needs refills until can find MD there. No new concerns. Meds reviewed. On PO instead of IV iron now and daily vit D instead of Drisdol. Uses albuterol inhaler about twice weekly, tramadol every 3-4 days, and Novolog tid. Sugars about the same. Will get labs in [place] at first visit. Stable BLE edema.  Review of Systems:  Review of Systems  Constitutional: Negative for activity change, appetite change, fever and unexpected weight change.  Respiratory: Negative for cough and shortness of breath.   Cardiovascular: Negative for chest pain.  Gastrointestinal: Negative for abdominal pain.  Endocrine: Negative for polydipsia and polyuria.  Neurological: Negative for dizziness, syncope and light-headedness.    Patient Active Problem List   Diagnosis Date Noted  . Chronic pain of right knee 09/17/2016  . Chronic gouty arthropathy without tophi 08/27/2016  . COPD (chronic obstructive pulmonary disease) ([place]) 01/10/2016  . Dietary iron deficiency anemia   . Idiopathic colitis   . Neoplasm of digestive system   . Gastritis   . Diabetic retinopathy, background ([place]) 11/14/2015  . CKD (chronic kidney disease) stage 3, GFR 30-59 ml/min 09/29/2015  . Vitamin D deficiency 09/29/2015  . Hypertension 09/28/2015  . Type 2 diabetes, uncontrolled, with neuropathy ([place]) 09/28/2015  . Hx of acute renal failure 09/28/2015  . Hyperlipidemia 09/28/2015  . Psoriatic arthritis ([place]) 09/28/2015  . Obesity, Class III, BMI 40-49.9 (morbid obesity) ([place]) 09/28/2015  . Spinal stenosis 09/28/2015  . GERD (gastroesophageal reflux disease) 09/28/2015  . Allergic rhinitis 09/28/2015  . Chronic back pain  09/28/2015  . OSA (obstructive sleep apnea) 09/28/2015  . History of left hip replacement 09/28/2015    Prior to Admission medications   Medication Sig Start Date End Date Taking? Authorizing Provider  Adalimumab 40 MG/0.8ML PNKT  04/16/16  Yes Historical Provider, MD  albuterol (PROVENTIL HFA;VENTOLIN HFA) 108 (90 Base) MCG/ACT inhaler Inhale 2 puffs into the lungs every 6 (six) hours as needed for wheezing or shortness of breath. 10/08/16  Yes [name], MD  amLODipine (NORVASC) 5 MG tablet Take 1 tablet (5 mg total) by mouth daily. 10/08/16  Yes [name], MD  atorvastatin (LIPITOR) 40 MG tablet Take 1 tablet (40 mg total) by mouth every evening. for cholesterol 10/08/16  Yes [name], MD  ferrous sulfate 325 (65 FE) MG tablet Take by mouth.   Yes Historical Provider, MD  fluticasone (FLONASE) 50 MCG/ACT nasal spray Place 2 sprays into both nostrils daily. 10/08/16  Yes [name], MD  Fluticasone Propionate, Inhal, 250 MCG/BLIST AEPB Inhale 1 puff into the lungs 2 (two) times daily. 10/08/16  Yes [name], MD  furosemide (LASIX) 40 MG tablet Take 1 tablet (40 mg total) by mouth daily. 10/08/16  Yes [name], MD  glucose blood ([employer] TEST STRIPS) test strip Use as instructed 01/10/16  Yes Historical Provider, MD  insulin aspart (NOVOLOG FLEXPEN) 100 UNIT/ML FlexPen As directed 10/08/16  Yes [name], MD  Insulin Pen Needle (FIFTY50 PEN NEEDLES) 32G X 4 MM MISC USE THREE TIMES A DAY WITH MEALS 11/24/15  Yes Historical Provider, MD  LANTUS SOLOSTAR 100 UNIT/ML Solostar Pen Inject  32 Units into the skin at bedtime. 10/08/16  Yes [name], MD  lisinopril (PRINIVIL,ZESTRIL) 40 MG tablet Take 1 tablet (40 mg total) by mouth daily. 10/08/16  Yes [name], MD  metFORMIN (GLUCOPHAGE) 500 MG tablet Take 1 tablet (500 mg total) by mouth 2 (two) times daily with a meal. 10/08/16  Yes [name], MD  PATADAY 0.2 % SOLN instill 1 drop into both eyes once daily for  itching 12/02/15  Yes Historical Provider, MD  pregabalin (LYRICA) 100 MG capsule Take 1 capsule (100 mg total) by mouth 3 (three) times daily. 10/08/16  Yes [name], MD  traMADol (ULTRAM) 50 MG tablet Take 1 tablet (50 mg total) by mouth every 6 (six) hours as needed. Reported on 11/17/2015 10/08/16  Yes [name], MD    No Known Allergies  Past Surgical History:  Procedure Laterality Date  . ABDOMINAL HYSTERECTOMY    . CHOLECYSTECTOMY    . COLONOSCOPY  2014   inconclusive  . COLONOSCOPY WITH PROPOFOL N/A 11/25/2015   Procedure: COLONOSCOPY WITH PROPOFOL;  Surgeon: Lucilla Lame, MD;  Location: [facility];  Service: Endoscopy;  Laterality: N/A;  . ESOPHAGOGASTRODUODENOSCOPY (EGD) WITH PROPOFOL N/A 11/25/2015   Procedure: ESOPHAGOGASTRODUODENOSCOPY (EGD) WITH PROPOFOL;  Surgeon: Lucilla Lame, MD;  Location: [facility];  Service: Endoscopy;  Laterality: N/A;  Diabetic - insulin and oral meds Sleep apnea  . HIP SURGERY Left     Social History  Substance Use Topics  . Smoking status: Former Smoker    Quit date: 10/23/2015  . Smokeless tobacco: Never Used  . Alcohol use No    Family History  Problem Relation Age of Onset  . Alzheimer's disease Mother   . Diabetes Father   . Diabetes Sister   . Breast cancer Neg Hx     Medication list has been reviewed and updated.  Physical Examination: BP 122/82   Pulse 81   Resp 16   Ht 5\' 3"  (1.6 m)   Wt 224 lb (101.6 kg)   BMI 39.68 kg/m   Physical Exam  Constitutional: She appears well-developed and well-nourished.  Cardiovascular: Normal rate, regular rhythm and normal heart sounds.   Pulmonary/Chest: Effort normal and breath sounds normal.  Musculoskeletal: She exhibits no edema.  Neurological: She is alert.  Skin: Skin is warm and dry.  Psychiatric: She has a normal mood and affect. Her behavior is normal.  Nursing note and vitals reviewed.   Assessment and Plan:  1. Type 2 diabetes, uncontrolled, with  neuropathy (HCC) A1c 8.2% in June, refill Lantus, metformin, Lyrica, and Novolog - LANTUS SOLOSTAR 100 UNIT/ML Solostar Pen; Inject 32 Units into the skin at bedtime.  Dispense: 15 mL; Refill: 1  2. Spinal stenosis of lumbosacral region Refill tramadol #30 only - traMADol (ULTRAM) 50 MG tablet; Take 1 tablet (50 mg total) by mouth every 6 (six) hours as needed. Reported on 11/17/2015  Dispense: 30 tablet; Refill: 0  3. Essential hypertension Well controlled, cont lisinopril, amlodipine  4. Acute allergic rhinitis, unspecified seasonality, unspecified trigger Refill Flonase  5. Chronic obstructive pulmonary disease, unspecified COPD type ([place]) Refill fluticasone/albuterol  6. Psoriatic arthritis ([place]) On Humira, followed by rheum  7. CKD (chronic kidney disease) stage 3, GFR 30-59 ml/min Cont BP meds  8. Hyperlipidemia, unspecified hyperlipidemia type Refill Lipitor  9. Obesity, Class III, BMI 40-49.9 (morbid obesity) ([place])  10. Vitamin D deficiency Cont supplement  11. Med review D/c Nexium and fish oil as no clear indication for  use  No Follow-up on file.  Satira Anis. [facility]  10/08/2016

## 2016-10-09 DIAGNOSIS — I8312 Varicose veins of left lower extremity with inflammation: Secondary | ICD-10-CM | POA: Diagnosis not present

## 2016-10-09 DIAGNOSIS — L4059 Other psoriatic arthropathy: Secondary | ICD-10-CM | POA: Diagnosis not present

## 2016-10-09 DIAGNOSIS — I8311 Varicose veins of right lower extremity with inflammation: Secondary | ICD-10-CM | POA: Diagnosis not present

## 2016-10-11 DIAGNOSIS — L4059 Other psoriatic arthropathy: Secondary | ICD-10-CM | POA: Diagnosis not present

## 2016-10-29 ENCOUNTER — Ambulatory Visit: Payer: Medicare Other | Admitting: Family Medicine

## 2016-10-30 ENCOUNTER — Ambulatory Visit: Payer: Medicare Other | Admitting: Family Medicine

## 2016-12-25 DIAGNOSIS — L4059 Other psoriatic arthropathy: Secondary | ICD-10-CM | POA: Diagnosis not present

## 2016-12-25 DIAGNOSIS — I10 Essential (primary) hypertension: Secondary | ICD-10-CM | POA: Diagnosis not present

## 2016-12-25 DIAGNOSIS — Z1389 Encounter for screening for other disorder: Secondary | ICD-10-CM | POA: Diagnosis not present

## 2016-12-25 DIAGNOSIS — Z758 Other problems related to medical facilities and other health care: Secondary | ICD-10-CM | POA: Diagnosis not present

## 2016-12-25 DIAGNOSIS — Z029 Encounter for administrative examinations, unspecified: Secondary | ICD-10-CM | POA: Diagnosis not present

## 2016-12-25 DIAGNOSIS — E139 Other specified diabetes mellitus without complications: Secondary | ICD-10-CM | POA: Diagnosis not present

## 2017-01-15 ENCOUNTER — Other Ambulatory Visit: Payer: Self-pay | Admitting: Family Medicine

## 2017-01-21 ENCOUNTER — Inpatient Hospital Stay: Payer: Medicare Other | Attending: Oncology

## 2017-01-28 ENCOUNTER — Inpatient Hospital Stay: Payer: Medicare Other | Admitting: Internal Medicine

## 2017-01-28 ENCOUNTER — Inpatient Hospital Stay: Payer: Medicare Other

## 2017-01-28 NOTE — Progress Notes (Deleted)
[facility] NOTE  Patient Care Team: [name], MD as PCP - General (Family Medicine) Cammie Sickle, MD as Consulting Physician (Oncology) Lucilla Lame, MD as Consulting Physician (Gastroenterology) Abby Percell Locus, MD as Consulting Physician (Endocrinology) Marlowe Sax, MD as Referring Physician (Rheumatology) Anabel Bene, MD as Referring Physician (Neurology) Sharlotte Alamo, DPM (Podiatry) Dagoberto Ligas, MD as Referring Physician (Nephrology) Ladora Daniel, MD ([place])  CHIEF COMPLAINTS/PURPOSE OF CONSULTATION:   # DEC 2016- ANEMIA IRON DEF Antonieta Pert studies-LOW; Hb-7.27 Nov 2015- EGD/[place]-Dr.Wohl- NEG]on PO iron; June 2017- IV venoferx4  # LEFT ADRENAL ADENOMA [CT/MRI march 2017]; CKD III [DM/HTN-Dr.Lateef]; Chronic back pain [sec to sciatica]   HISTORY OF PRESENTING ILLNESS:  Kathleen Lee 61 y.o.  female with above history of CKD- secondary to diabetes hypertension- and also history of iron deficiency anemia is here for follow-up.  The patient status post IV iron significant improvement of her fatigue. However continues to feel fatigued; short of breath with exertion. Denies any blood in stools black red stools.  ROS: A complete 10 point review of system is done which is negative except mentioned above in history of present illness. Patient has mild swelling in the legs chronic.  MEDICAL HISTORY:  Past Medical History:  Diagnosis Date  . Allergy   . Anemia   . Arthritis    psoriatic  . Chronic kidney disease    stage 3  . COPD (chronic obstructive pulmonary disease) ([place])   . Diabetes mellitus without complication ([place])   . GERD (gastroesophageal reflux disease)   . Hip pain   . Hyperlipidemia   . Hypertension   . Renal insufficiency   . Sleep apnea 11/18/15   new Dx  . Spinal stenosis    lower back    SURGICAL HISTORY: Past Surgical History:  Procedure Laterality Date  . ABDOMINAL HYSTERECTOMY    .  CHOLECYSTECTOMY    . COLONOSCOPY  2014   inconclusive  . COLONOSCOPY WITH PROPOFOL N/A 11/25/2015   Procedure: COLONOSCOPY WITH PROPOFOL;  Surgeon: Lucilla Lame, MD;  Location: [facility];  Service: Endoscopy;  Laterality: N/A;  . ESOPHAGOGASTRODUODENOSCOPY (EGD) WITH PROPOFOL N/A 11/25/2015   Procedure: ESOPHAGOGASTRODUODENOSCOPY (EGD) WITH PROPOFOL;  Surgeon: Lucilla Lame, MD;  Location: [facility];  Service: Endoscopy;  Laterality: N/A;  Diabetic - insulin and oral meds Sleep apnea  . HIP SURGERY Left     SOCIAL HISTORY: Social History   Social History  . Marital status: Single    Spouse name: N/A  . Number of children: N/A  . Years of education: N/A   Occupational History  . Not on file.   Social History Main Topics  . Smoking status: Former Smoker    Quit date: 10/23/2015  . Smokeless tobacco: Never Used  . Alcohol use No  . Drug use: No  . Sexual activity: Not Currently   Other Topics Concern  . Not on file   Social History Narrative  . No narrative on file    FAMILY HISTORY: Family History  Problem Relation Age of Onset  . Alzheimer's disease Mother   . Diabetes Father   . Diabetes Sister   . Breast cancer Neg Hx     ALLERGIES:  has No Known Allergies.  MEDICATIONS:  Current Outpatient Prescriptions  Medication Sig Dispense Refill  . Adalimumab 40 MG/0.8ML PNKT     . albuterol (PROVENTIL HFA;VENTOLIN HFA) 108 (90 Base) MCG/ACT inhaler Inhale 2 puffs into the lungs every 6 (six)  hours as needed for wheezing or shortness of breath. 18 g 0  . amLODipine (NORVASC) 5 MG tablet Take 1 tablet (5 mg total) by mouth daily. 90 tablet 3  . atorvastatin (LIPITOR) 40 MG tablet Take 1 tablet (40 mg total) by mouth every evening. for cholesterol 90 tablet 3  . BD PEN NEEDLE NANO U/F 32G X 4 MM MISC USE ONE PEN NEEDLE TO INJECT INSULIN  THREE TIMES DAILY WITH MEALS 300 each 0  . ferrous sulfate 325 (65 FE) MG tablet Take by mouth.    . fluticasone (FLONASE)  50 MCG/ACT nasal spray Place 2 sprays into both nostrils daily. 16 g 3  . Fluticasone Propionate, Inhal, 250 MCG/BLIST AEPB Inhale 1 puff into the lungs 2 (two) times daily. 60 each 5  . furosemide (LASIX) 40 MG tablet Take 1 tablet (40 mg total) by mouth daily. 90 tablet 3  . glucose blood ([employer] TEST STRIPS) test strip Use as instructed    . insulin aspart (NOVOLOG FLEXPEN) 100 UNIT/ML FlexPen As directed 15 pen 1  . LANTUS SOLOSTAR 100 UNIT/ML Solostar Pen Inject 32 Units into the skin at bedtime. 15 mL 1  . lisinopril (PRINIVIL,ZESTRIL) 40 MG tablet Take 1 tablet (40 mg total) by mouth daily. 90 tablet 3  . metFORMIN (GLUCOPHAGE) 500 MG tablet Take 1 tablet (500 mg total) by mouth 2 (two) times daily with a meal. 180 tablet 3  . PATADAY 0.2 % SOLN instill 1 drop into both eyes once daily for itching  0  . pregabalin (LYRICA) 100 MG capsule Take 1 capsule (100 mg total) by mouth 3 (three) times daily. 90 capsule 5  . traMADol (ULTRAM) 50 MG tablet Take 1 tablet (50 mg total) by mouth every 6 (six) hours as needed. Reported on 11/17/2015 30 tablet 0   No current facility-administered medications for this visit.       Marland Kitchen  PHYSICAL EXAMINATION:  There were no vitals filed for this visit. There were no vitals filed for this visit.  GENERAL: Well-nourished well-developed; Alert, no distress and comfortable.  Alone. Obese. Walks with a cane. EYES: no pallor or icterus OROPHARYNX: no thrush or ulceration; good dentition  NECK: supple, no masses felt LYMPH:  no palpable lymphadenopathy in the cervical, axillary or inguinal regions LUNGS: clear to auscultation and  No wheeze or crackles HEART/CVS: regular rate & rhythm and no murmurs; 1+ bilateral lower extremity edema ABDOMEN: abdomen soft, non-tender and normal bowel sounds Musculoskeletal:no cyanosis of digits and no clubbing  PSYCH: alert & oriented x 3 with fluent speech NEURO: no focal motor/sensory deficits SKIN:  no rashes or  significant lesions  LABORATORY DATA:  I have reviewed the data as listed Lab Results  Component Value Date   WBC 6.7 07/23/2016   HGB 11.0 (L) 07/23/2016   HCT 33.4 (L) 07/23/2016   MCV 81.0 07/23/2016   PLT 332 07/23/2016    Recent Labs  07/23/16 1030  NA 139  K 3.9  CL 104  CO2 27  GLUCOSE 65  BUN 23*  CREATININE 1.79*  CALCIUM 8.7*  GFRNONAA 30*  GFRAA 35*  PROT 7.2  ALBUMIN 3.6  AST 18  ALT 16  ALKPHOS 99  BILITOT 0.3    RADIOGRAPHIC STUDIES: I have personally reviewed the radiological images as listed and agreed with the findings in the report. No results found.  ASSESSMENT & PLAN:  No problem-specific Assessment & Plan notes found for this encounter.     Lenetta Quaker  Ann Lions, MD 01/28/2017 8:28 AM

## 2017-01-28 NOTE — Assessment & Plan Note (Deleted)
Iron deficiency anemia/the context of chronic kidney disease. Negative GI workup. Patient to iron once a day. Recommend IV Venofer today. Saturation 6%.   # 6 months/labs- iron-ferritin/ IV venofer q 6 M

## 2017-03-06 ENCOUNTER — Other Ambulatory Visit: Payer: Self-pay | Admitting: Family Medicine

## 2017-03-06 DIAGNOSIS — E1165 Type 2 diabetes mellitus with hyperglycemia: Principal | ICD-10-CM

## 2017-03-06 DIAGNOSIS — IMO0002 Reserved for concepts with insufficient information to code with codable children: Secondary | ICD-10-CM

## 2017-03-06 DIAGNOSIS — E1140 Type 2 diabetes mellitus with diabetic neuropathy, unspecified: Secondary | ICD-10-CM

## 2017-03-06 NOTE — Telephone Encounter (Signed)
Prescription request for Lyrica and Basaglar in Rx refill request box.

## 2017-03-11 DIAGNOSIS — L4059 Other psoriatic arthropathy: Secondary | ICD-10-CM | POA: Diagnosis not present

## 2017-03-11 DIAGNOSIS — M358 Other specified systemic involvement of connective tissue: Secondary | ICD-10-CM | POA: Diagnosis not present

## 2017-03-11 DIAGNOSIS — Z111 Encounter for screening for respiratory tuberculosis: Secondary | ICD-10-CM | POA: Diagnosis not present

## 2017-03-11 DIAGNOSIS — M545 Low back pain: Secondary | ICD-10-CM | POA: Diagnosis not present

## 2017-03-11 DIAGNOSIS — M1A09X1 Idiopathic chronic gout, multiple sites, with tophus (tophi): Secondary | ICD-10-CM | POA: Diagnosis not present

## 2017-03-11 DIAGNOSIS — M1389 Other specified arthritis, multiple sites: Secondary | ICD-10-CM | POA: Diagnosis not present

## 2017-04-02 DIAGNOSIS — E139 Other specified diabetes mellitus without complications: Secondary | ICD-10-CM | POA: Diagnosis not present

## 2017-04-02 DIAGNOSIS — Z87891 Personal history of nicotine dependence: Secondary | ICD-10-CM | POA: Diagnosis not present

## 2017-04-02 DIAGNOSIS — J449 Chronic obstructive pulmonary disease, unspecified: Secondary | ICD-10-CM | POA: Diagnosis not present

## 2017-04-02 DIAGNOSIS — J018 Other acute sinusitis: Secondary | ICD-10-CM | POA: Diagnosis not present

## 2017-04-08 ENCOUNTER — Other Ambulatory Visit: Payer: Self-pay | Admitting: Family Medicine

## 2017-04-17 DIAGNOSIS — L4050 Arthropathic psoriasis, unspecified: Secondary | ICD-10-CM | POA: Diagnosis not present

## 2017-04-17 DIAGNOSIS — E109 Type 1 diabetes mellitus without complications: Secondary | ICD-10-CM | POA: Diagnosis not present

## 2017-04-17 DIAGNOSIS — M1A09X1 Idiopathic chronic gout, multiple sites, with tophus (tophi): Secondary | ICD-10-CM | POA: Diagnosis not present

## 2017-04-17 DIAGNOSIS — T464X5A Adverse effect of angiotensin-converting-enzyme inhibitors, initial encounter: Secondary | ICD-10-CM | POA: Diagnosis not present

## 2017-04-17 DIAGNOSIS — E11641 Type 2 diabetes mellitus with hypoglycemia with coma: Secondary | ICD-10-CM | POA: Diagnosis not present

## 2017-04-17 DIAGNOSIS — E663 Overweight: Secondary | ICD-10-CM | POA: Diagnosis not present

## 2017-04-17 DIAGNOSIS — I129 Hypertensive chronic kidney disease with stage 1 through stage 4 chronic kidney disease, or unspecified chronic kidney disease: Secondary | ICD-10-CM | POA: Diagnosis not present

## 2017-04-17 DIAGNOSIS — Z96642 Presence of left artificial hip joint: Secondary | ICD-10-CM | POA: Diagnosis not present

## 2017-04-17 DIAGNOSIS — N183 Chronic kidney disease, stage 3 (moderate): Secondary | ICD-10-CM | POA: Diagnosis not present

## 2017-04-17 DIAGNOSIS — T783XXA Angioneurotic edema, initial encounter: Secondary | ICD-10-CM | POA: Diagnosis not present

## 2017-04-17 DIAGNOSIS — Z743 Need for continuous supervision: Secondary | ICD-10-CM | POA: Diagnosis not present

## 2017-04-17 DIAGNOSIS — M545 Low back pain: Secondary | ICD-10-CM | POA: Diagnosis not present

## 2017-04-17 DIAGNOSIS — L4059 Other psoriatic arthropathy: Secondary | ICD-10-CM | POA: Diagnosis not present

## 2017-04-18 DIAGNOSIS — M4800 Spinal stenosis, site unspecified: Secondary | ICD-10-CM | POA: Diagnosis not present

## 2017-04-18 DIAGNOSIS — T783XXA Angioneurotic edema, initial encounter: Secondary | ICD-10-CM | POA: Diagnosis not present

## 2017-04-18 DIAGNOSIS — L4050 Arthropathic psoriasis, unspecified: Secondary | ICD-10-CM | POA: Diagnosis not present

## 2017-04-18 DIAGNOSIS — M549 Dorsalgia, unspecified: Secondary | ICD-10-CM | POA: Diagnosis not present

## 2017-04-18 DIAGNOSIS — E669 Obesity, unspecified: Secondary | ICD-10-CM | POA: Diagnosis not present

## 2017-04-18 DIAGNOSIS — Z96642 Presence of left artificial hip joint: Secondary | ICD-10-CM | POA: Diagnosis not present

## 2017-04-18 DIAGNOSIS — E783 Hyperchylomicronemia: Secondary | ICD-10-CM | POA: Diagnosis not present

## 2017-04-18 DIAGNOSIS — E1165 Type 2 diabetes mellitus with hyperglycemia: Secondary | ICD-10-CM | POA: Diagnosis not present

## 2017-04-18 DIAGNOSIS — I129 Hypertensive chronic kidney disease with stage 1 through stage 4 chronic kidney disease, or unspecified chronic kidney disease: Secondary | ICD-10-CM | POA: Diagnosis not present

## 2017-04-18 DIAGNOSIS — E109 Type 1 diabetes mellitus without complications: Secondary | ICD-10-CM | POA: Diagnosis not present

## 2017-04-18 DIAGNOSIS — F17210 Nicotine dependence, cigarettes, uncomplicated: Secondary | ICD-10-CM | POA: Diagnosis not present

## 2017-04-18 DIAGNOSIS — E119 Type 2 diabetes mellitus without complications: Secondary | ICD-10-CM | POA: Diagnosis not present

## 2017-04-18 DIAGNOSIS — T464X5A Adverse effect of angiotensin-converting-enzyme inhibitors, initial encounter: Secondary | ICD-10-CM | POA: Diagnosis not present

## 2017-04-18 DIAGNOSIS — Z6839 Body mass index (BMI) 39.0-39.9, adult: Secondary | ICD-10-CM | POA: Diagnosis not present

## 2017-04-30 DIAGNOSIS — Z1329 Encounter for screening for other suspected endocrine disorder: Secondary | ICD-10-CM | POA: Diagnosis not present

## 2017-04-30 DIAGNOSIS — L408 Other psoriasis: Secondary | ICD-10-CM | POA: Diagnosis not present

## 2017-04-30 DIAGNOSIS — Z1389 Encounter for screening for other disorder: Secondary | ICD-10-CM | POA: Diagnosis not present

## 2017-04-30 DIAGNOSIS — M48062 Spinal stenosis, lumbar region with neurogenic claudication: Secondary | ICD-10-CM | POA: Diagnosis not present

## 2017-04-30 DIAGNOSIS — Z13228 Encounter for screening for other metabolic disorders: Secondary | ICD-10-CM | POA: Diagnosis not present

## 2017-04-30 DIAGNOSIS — K219 Gastro-esophageal reflux disease without esophagitis: Secondary | ICD-10-CM | POA: Diagnosis not present

## 2017-04-30 DIAGNOSIS — G4739 Other sleep apnea: Secondary | ICD-10-CM | POA: Diagnosis not present

## 2017-04-30 DIAGNOSIS — Z0000 Encounter for general adult medical examination without abnormal findings: Secondary | ICD-10-CM | POA: Diagnosis not present

## 2017-04-30 DIAGNOSIS — Z13220 Encounter for screening for lipoid disorders: Secondary | ICD-10-CM | POA: Diagnosis not present

## 2017-04-30 DIAGNOSIS — I10 Essential (primary) hypertension: Secondary | ICD-10-CM | POA: Diagnosis not present

## 2017-04-30 DIAGNOSIS — Z130 Encounter for screening for diseases of the blood and blood-forming organs and certain disorders involving the immune mechanism: Secondary | ICD-10-CM | POA: Diagnosis not present

## 2017-04-30 DIAGNOSIS — E1169 Type 2 diabetes mellitus with other specified complication: Secondary | ICD-10-CM | POA: Diagnosis not present

## 2017-04-30 DIAGNOSIS — N08 Glomerular disorders in diseases classified elsewhere: Secondary | ICD-10-CM | POA: Diagnosis not present

## 2017-04-30 DIAGNOSIS — M5432 Sciatica, left side: Secondary | ICD-10-CM | POA: Diagnosis not present

## 2017-04-30 DIAGNOSIS — Z789 Other specified health status: Secondary | ICD-10-CM | POA: Diagnosis not present

## 2017-04-30 DIAGNOSIS — D508 Other iron deficiency anemias: Secondary | ICD-10-CM | POA: Diagnosis not present

## 2017-04-30 DIAGNOSIS — L4059 Other psoriatic arthropathy: Secondary | ICD-10-CM | POA: Diagnosis not present

## 2017-05-01 DIAGNOSIS — Z129 Encounter for screening for malignant neoplasm, site unspecified: Secondary | ICD-10-CM | POA: Diagnosis not present

## 2017-06-04 DIAGNOSIS — E139 Other specified diabetes mellitus without complications: Secondary | ICD-10-CM | POA: Diagnosis not present

## 2017-06-04 DIAGNOSIS — I10 Essential (primary) hypertension: Secondary | ICD-10-CM | POA: Diagnosis not present

## 2017-06-04 DIAGNOSIS — L03119 Cellulitis of unspecified part of limb: Secondary | ICD-10-CM | POA: Diagnosis not present

## 2017-06-08 IMAGING — MR MR ABDOMEN W/O CM
9 series · 46 of 48 positions shown · non-contrast
Comparison: 12/27/2015

CLINICAL DATA: Left adrenal lesion on prior CT.

EXAM:
MRI ABDOMEN WITHOUT CONTRAST
TECHNIQUE: Multiplanar multisequence MR imaging was performed without the
administration of intravenous contrast.

[Series 2: cor ssfse / · coronal · 7.0mm · 1.48mm/px · 4 of 35 slices shown]
[im 1/35]
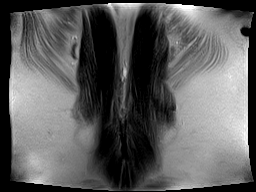
[im 12/35]
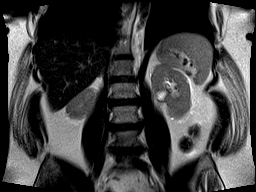
[im 23/35]
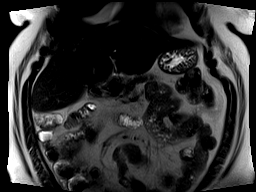
[im 35/35]
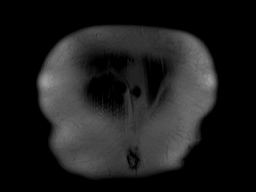

[Series 3: T2 · axial · 6.0mm · 1.56mm/px · z∈[-125,+105]mm · 4 of 33 slices shown]
[im 1/33]
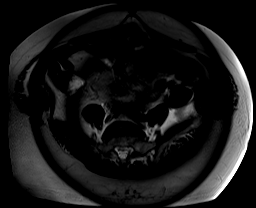
[im 11/33]
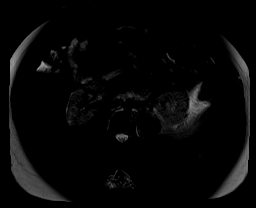
[im 22/33]
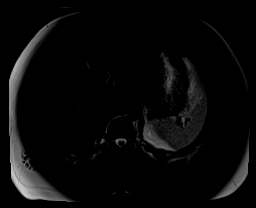
[im 33/33]
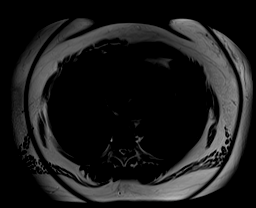

[Series 7: DWI · axial · 6.0mm · 2.08mm/px · z∈[-132,+112]mm · 10 of 105 slices shown (1 of 2)]
[im 1/105]
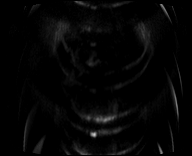
[im 10/105]
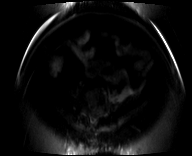
[im 19/105]
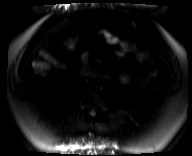
[im 29/105]
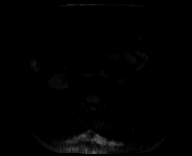
[im 48/105]
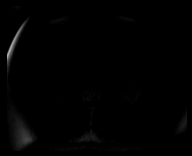
[im 57/105]
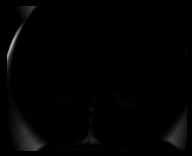
[im 76/105]
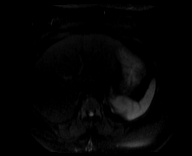
[im 86/105]
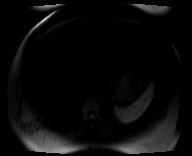
[im 95/105]
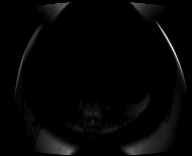
[im 105/105]
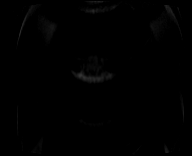

[Series 8: DWI · axial · 6.0mm · 2.08mm/px · z∈[-132,+112]mm · 4 of 35 slices shown (2 of 2)]
[im 1/35]
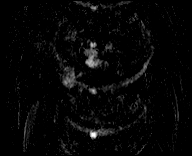
[im 12/35]
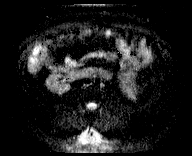
[im 23/35]
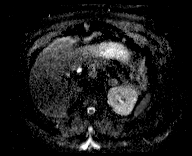
[im 35/35]
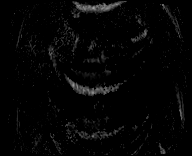

[Series 9: axial dynamic pre · axial · non-contrast · 4.0mm · 1.28mm/px · z∈[-136,+116]mm · 8 of 64 slices shown]
[im 1/64]
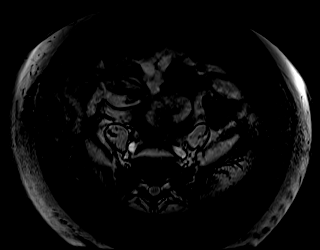
[im 10/64]
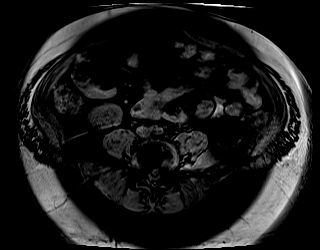
[im 19/64]
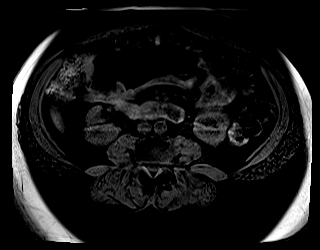
[im 28/64]
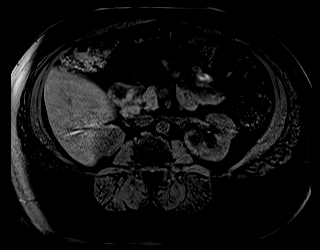
[im 37/64]
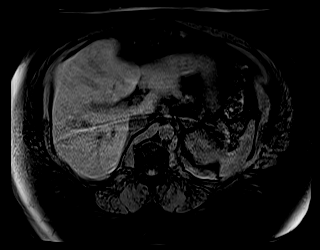
[im 46/64]
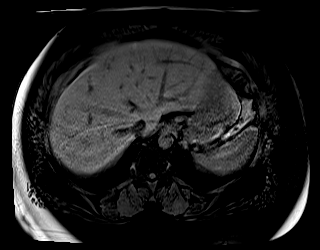
[im 55/64]
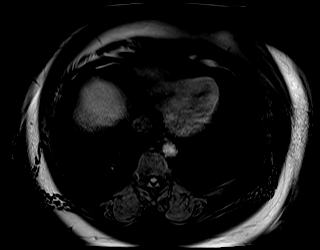
[im 64/64]
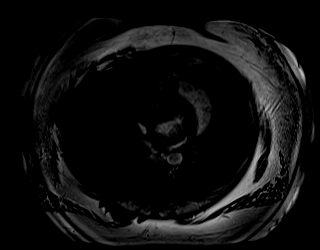

[Series 100: out of phase · axial · 6.0mm · 0.78mm/px · z∈[-125,+105]mm · 4 of 33 slices shown (1 of 2)]
[im 1/33]
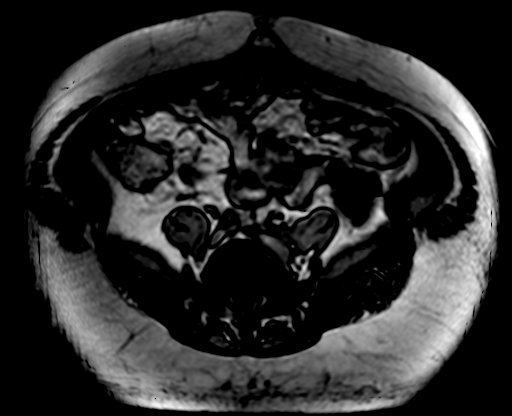
[im 11/33]
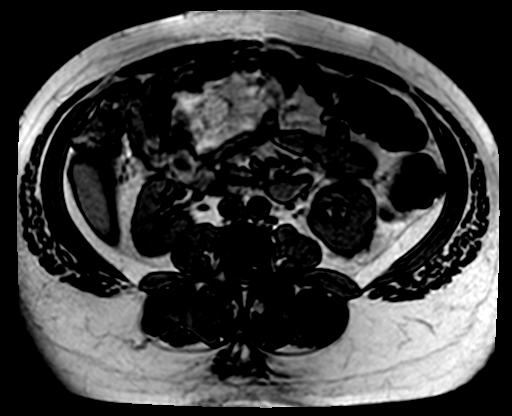
[im 22/33]
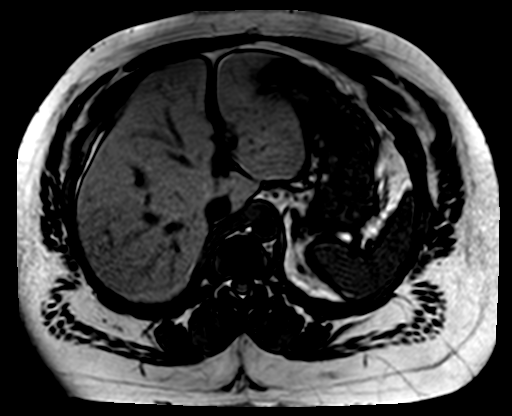
[im 33/33]
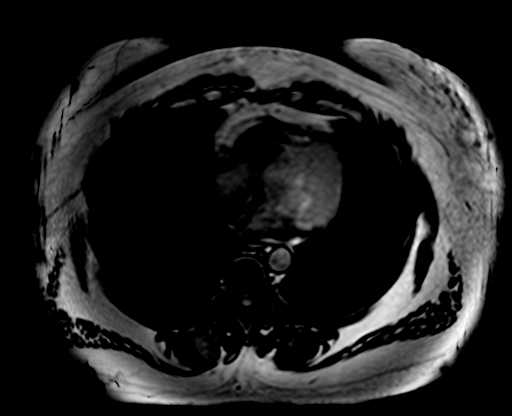

[Series 101: in phase · axial · 6.0mm · 0.78mm/px · z∈[-125,+105]mm · 4 of 33 slices shown]
[im 1/33]
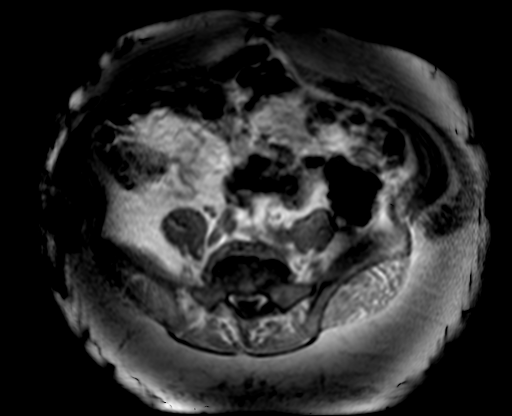
[im 11/33]
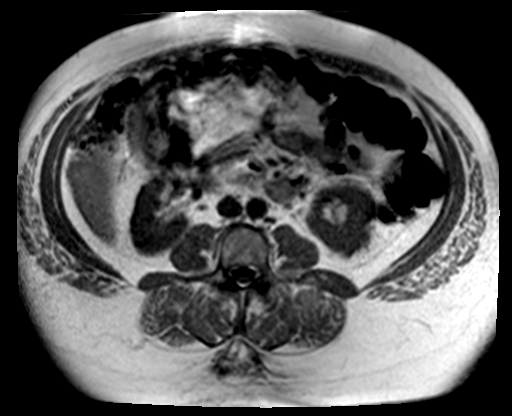
[im 22/33]
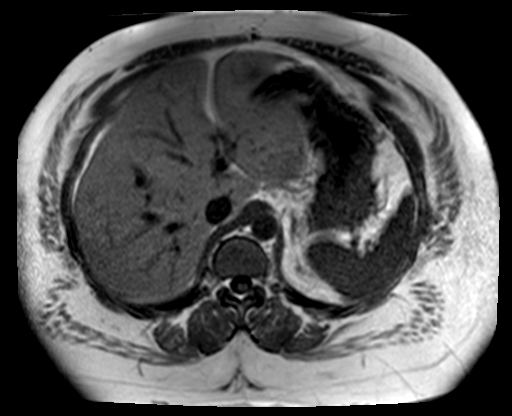
[im 33/33]
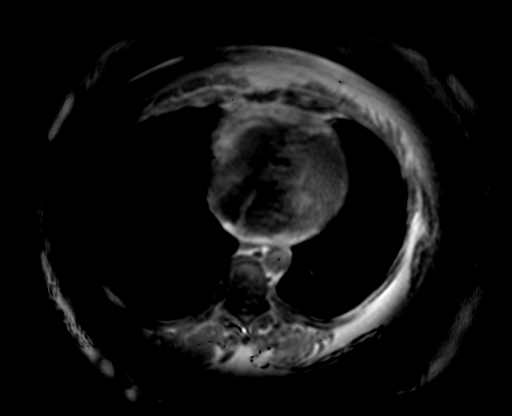

[Series 103: out of phase · coronal · 6.0mm · 0.78mm/px · 4 of 32 slices shown (2 of 2)]
[im 1/32]
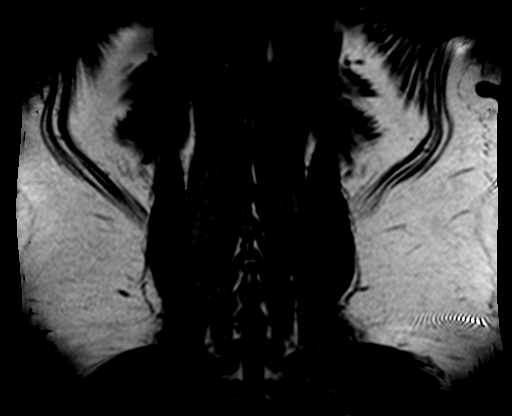
[im 11/32]
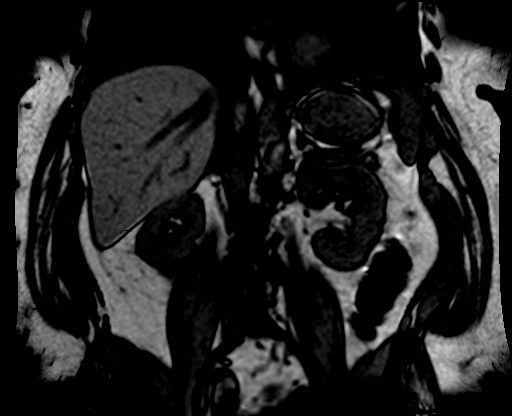
[im 21/32]
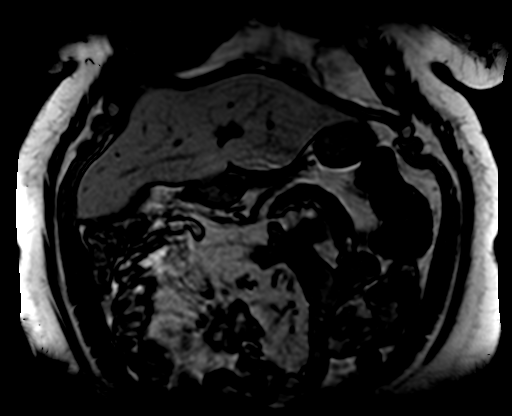
[im 32/32]
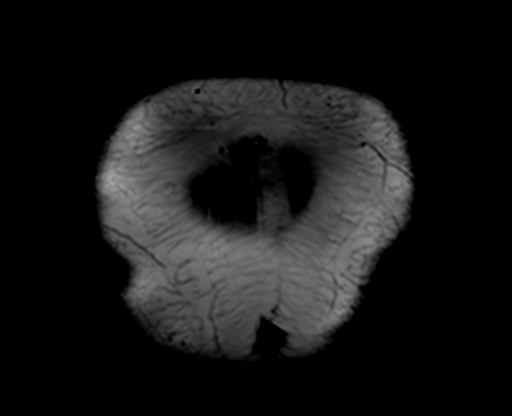

[Series 104: in phase coronal · coronal · 6.0mm · 0.78mm/px · 4 of 35 slices shown]
[im 1/35]
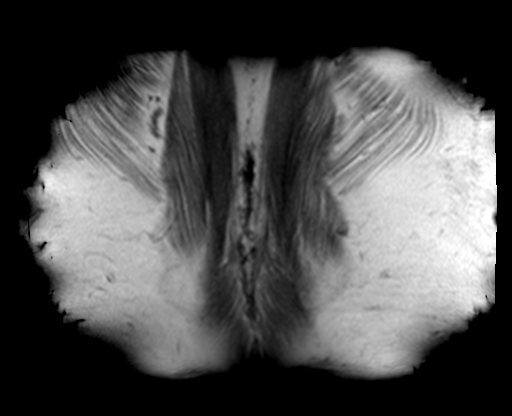
[im 12/35]
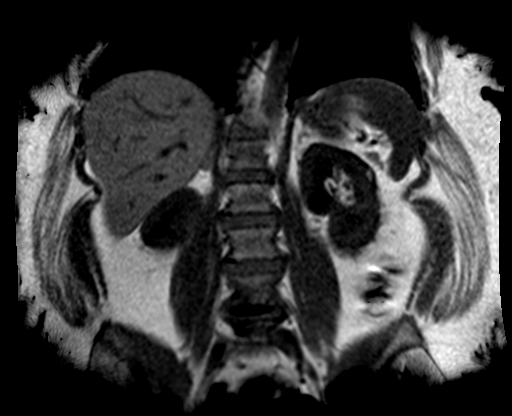
[im 23/35]
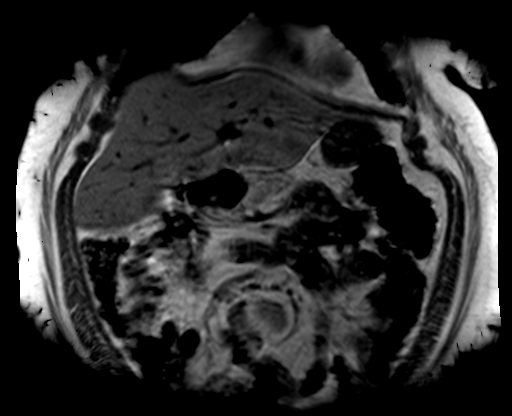
[im 35/35]
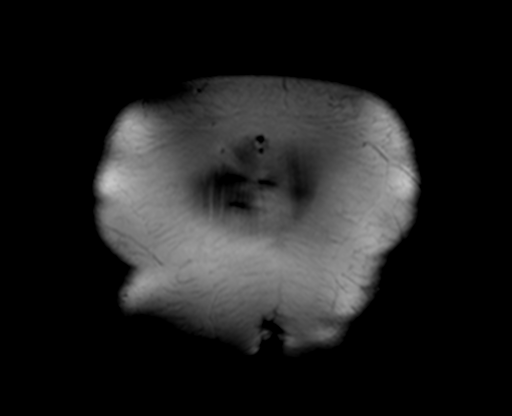

[46 of 48 positions shown; findings below may reference images not displayed]

FINDINGS: Minimal degradation secondary to motion and patient body habitus.

Lower chest: Normal heart size without pericardial or pleural
effusion.

Hepatobiliary: Normal liver. Cholecystectomy, without biliary ductal
dilatation.

Pancreas: Normal, without mass or ductal dilatation.

Spleen: Normal in size, without focal abnormality.

Adrenals/Urinary Tract: Normal right adrenal gland. Bilateral T2
hyperintense renal lesions are likely cysts.

Left adrenal nodule measures 1.8 x 1.9 cm on image 28/ series 9.
Similar. Demonstrates significant signal dropout on out of phase
image 14/series 100.

Stomach/Bowel: Normal stomach and abdominal small bowel loops.
Colonic stool burden suggests constipation.

Vascular/Lymphatic: Normal caliber of the aorta and branch vessels.
No retroperitoneal or retrocrural adenopathy.

Other: No ascites.

Musculoskeletal: No acute osseous abnormality.
IMPRESSION: 1. Left adrenal adenoma.
2.  Possible constipation.

## 2017-06-19 DIAGNOSIS — E663 Overweight: Secondary | ICD-10-CM | POA: Diagnosis not present

## 2017-06-19 DIAGNOSIS — L03116 Cellulitis of left lower limb: Secondary | ICD-10-CM | POA: Diagnosis not present

## 2017-06-19 DIAGNOSIS — Z79899 Other long term (current) drug therapy: Secondary | ICD-10-CM | POA: Diagnosis not present

## 2017-06-19 DIAGNOSIS — M1A09X1 Idiopathic chronic gout, multiple sites, with tophus (tophi): Secondary | ICD-10-CM | POA: Diagnosis not present

## 2017-06-19 DIAGNOSIS — L03115 Cellulitis of right lower limb: Secondary | ICD-10-CM | POA: Diagnosis not present

## 2017-06-19 DIAGNOSIS — L4059 Other psoriatic arthropathy: Secondary | ICD-10-CM | POA: Diagnosis not present

## 2017-07-02 DIAGNOSIS — Z23 Encounter for immunization: Secondary | ICD-10-CM | POA: Diagnosis not present

## 2017-08-07 ENCOUNTER — Other Ambulatory Visit: Payer: Self-pay | Admitting: Family Medicine

## 2017-10-23 ENCOUNTER — Other Ambulatory Visit: Payer: Self-pay | Admitting: Family Medicine

## 2017-10-29 ENCOUNTER — Other Ambulatory Visit: Payer: Self-pay | Admitting: Family Medicine

## 2017-11-11 ENCOUNTER — Other Ambulatory Visit: Payer: Self-pay | Admitting: Family Medicine

## 2018-03-29 ENCOUNTER — Emergency Department: Payer: MEDICARE

## 2018-03-29 ENCOUNTER — Emergency Department: Admit: 2018-03-30 | Payer: MEDICARE

## 2018-03-29 DIAGNOSIS — E162 Hypoglycemia, unspecified: Principal | ICD-10-CM

## 2018-03-29 NOTE — Discharge Instructions (Signed)
Call [phone] for an appointment with:    [facility]  [address]  [place] [address]    They will see you even if you do not have insurance. You should be seen within the next 2-3 days.

## 2018-03-29 NOTE — ED Provider Notes (Signed)
[facility] EMERGENCY DEPARTMENT  EMERGENCY DEPARTMENT ENCOUNTER        Pt Name: Kirsten Morrison  MRN: [id number]  Birthdate [birth year]  Date of evaluation: 03/29/2018  Provider: Mikki Harbor, PA-C  PCP: No primary care provider on file.    This patient was seen and evaluated by the attending physician Tomasa Hosteller, DO.      CHIEF COMPLAINT       Chief Complaint   Patient presents with   ??? Hypoglycemia     Pt arrival via [facility] for c/o AMS and hypoglycemia. Per squad report, pt was driving with family, took insulin before eating, adn was altered and combative on scene. BS 36, glucagon IM given by squad. Pt alert, oriented, pleasant on arrival.        HISTORY OF PRESENT ILLNESS   (Location/Symptom, Timing/Onset, Context/Setting, Quality, Duration, Modifying Factors, Severity)  Note limiting factors.     Kirsten Morrison is a 62 y.o. female  who presents for evaluation of hypoglycemia and altered mental status. Per patient, patient was driving in the car with her family on the way to dinner when she preemptively administered her insulin, 16 units. She then became unresponsive. By the time squad arrived, her blood glucose was 36. She was very altered and combative. They gave her IM glucagon. She is now more alert but still altered. Complaining of headache and dizziness. No chest pain or shortness of breath. No abdominal pain, nausea or vomiting. She is not on any oral anti-hyperglycemics. She states that she has been having a lot of low episodes lately. She has not seen her primary care doctor regarding this. She has no other complaints or concerns at this time.    Nursing Notes were all reviewed and agreed with or any disagreements were addressed  in the HPI.    REVIEW OF SYSTEMS    (2-9 systems for level 4, 10 or more for level 5)     Review of Systems   Constitutional: Negative for appetite change, chills and fever.   HENT: Negative for congestion and rhinorrhea.    Respiratory: Negative for cough,  shortness of breath and wheezing.    Cardiovascular: Negative for chest pain.   Gastrointestinal: Negative for abdominal pain, diarrhea, nausea and vomiting.   Genitourinary: Negative for difficulty urinating, dysuria and hematuria.   Musculoskeletal: Negative for neck pain and neck stiffness.   Skin: Negative for rash.   Neurological: Positive for dizziness and headaches.   Psychiatric/Behavioral: Positive for agitation and confusion.       Positives and Pertinent negatives as per HPI.  Except as noted abovein the ROS, all other systems were reviewed and negative.       PAST MEDICAL HISTORY   No past medical history on file.      SURGICAL HISTORY   No past surgical history on file.      CURRENTMEDICATIONS       Previous Medications    No medications on file         ALLERGIES     Lisinopril    FAMILYHISTORY     No family history on file.       SOCIAL HISTORY       Social History     Socioeconomic History   ??? Marital status: Unknown     Spouse name: Not on file   ??? Number of children: Not on file   ??? Years of education: Not on file   ??? Highest education  level: Not on file   Occupational History   ??? Not on file   Social Needs   ??? Financial resource strain: Not on file   ??? Food insecurity:     Worry: Not on file     Inability: Not on file   ??? Transportation needs:     Medical: Not on file     Non-medical: Not on file   Tobacco Use   ??? Smoking status: Not on file   Substance and Sexual Activity   ??? Alcohol use: Not on file   ??? Drug use: Not on file   ??? Sexual activity: Not on file   Lifestyle   ??? Physical activity:     Days per week: Not on file     Minutes per session: Not on file   ??? Stress: Not on file   Relationships   ??? Social connections:     Talks on phone: Not on file     Gets together: Not on file     Attends religious service: Not on file     Active member of club or organization: Not on file     Attends meetings of clubs or organizations: Not on file     Relationship status: Not on file   ??? Intimate partner  violence:     Fear of current or ex partner: Not on file     Emotionally abused: Not on file     Physically abused: Not on file     Forced sexual activity: Not on file   Other Topics Concern   ??? Not on file   Social History Narrative   ??? Not on file       SCREENINGS             PHYSICAL EXAM    (up to 7 for level 4, 8 or more for level 5)     ED Triage Vitals [03/29/18 2131]   BP Temp Temp src Pulse Resp SpO2 Height Weight   -- -- -- 98 24 98 % -- --       Physical Exam   Constitutional: She is oriented to person, place, and time. She appears well-developed and well-nourished.   HENT:   Head: Normocephalic and atraumatic. Head is without raccoon's eyes, without Battle's sign, without abrasion, without contusion and without laceration.   Right Ear: External ear normal.   Left Ear: External ear normal.   Nose: Nose normal.   Eyes: Right eye exhibits no discharge. Left eye exhibits no discharge.   Neck: Normal range of motion. Neck supple. No spinous process tenderness and no muscular tenderness present.   Cardiovascular: Normal rate, regular rhythm and normal heart sounds.   No murmur heard.  Pulmonary/Chest: Effort normal and breath sounds normal. No respiratory distress. She has no wheezes.   Abdominal: Soft. She exhibits no distension and no mass. There is no tenderness. There is no guarding.   Musculoskeletal: Normal range of motion.   Neurological: She is alert and oriented to person, place, and time. She is not disoriented. No cranial nerve deficit. GCS eye subscore is 4. GCS verbal subscore is 5. GCS motor subscore is 6.   Skin: Skin is warm and dry. She is not diaphoretic.   Psychiatric: She has a normal mood and affect. Her behavior is normal.   Nursing note and vitals reviewed.      DIAGNOSTIC RESULTS   LABS:    Labs Reviewed   CBC WITH AUTO DIFFERENTIAL -  Abnormal; Notable for the following components:       Result Value    RDW 16.2 (*)     All other components within normal limits    Narrative:      Performed at:  [facility]  [address],  [place], [place] [address]   Phone [phone]   COMPREHENSIVE METABOLIC PANEL - Abnormal; Notable for the following components:    Glucose 58 (*)     CREATININE 2.0 (*)     GFR Non-African American 25 (*)     GFR African American 31 (*)     Total Protein 8.5 (*)     Albumin/Globulin Ratio 0.8 (*)     AST 42 (*)     All other components within normal limits    Narrative:     Performed at:  [facility]  [address]  [place], [place] [address]   Phone [phone]   URINE RT REFLEX TO CULTURE - Abnormal; Notable for the following components:    Glucose, Ur 250 (*)     Blood, Urine TRACE (*)     Protein, UA >=300 (*)     All other components within normal limits    Narrative:     Performed at:  [facility]  [address],  [place], [place] [address]   Phone [phone]   LIPASE - Abnormal; Notable for the following components:    Lipase 11.0 (*)     All other components within normal limits    Narrative:     Performed at:  [facility]  [address],  [place], [place] [address]   Phone [phone]   ACETAMINOPHEN LEVEL - Abnormal; Notable for the following components:    Acetaminophen Level <5 (*)     All other components within normal limits    Narrative:     Performed at:  [facility]  [address],  [place], [place] [address]   Phone [phone]   SALICYLATE LEVEL - Abnormal; Notable for the following components:    Salicylate, Serum <0.3 (*)     All other components within normal limits    Narrative:     Performed at:  [facility]  [address],  [place], [place] [address]   Phone [phone]   POCT GLUCOSE - Abnormal; Notable for the following components:    POC Glucose 57 (*)     All other components within normal limits    Narrative:     Performed at:  [facility]  [address],  [place], [place] [address]   Phone [phone]   POCT GLUCOSE - Abnormal; Notable for the following components:    POC Glucose 208 (*)     All other components within normal limits    Narrative:     Performed at:  [facility]  [address]  [place], [place] [address]   Phone [phone]   TROPONIN    Narrative:     Performed at:  [facility]  [address]  [place], [place] [address]   Phone [phone]   URINE DRUG SCREEN    Narrative:     Performed at:  [facility]  [address]  [place], [place] [address]   Phone [phone]  [phone]   ETHANOL    Narrative:     Performed at:  [facility]  [address],  [place], [place] [address]   Phone [phone]   MICROSCOPIC URINALYSIS    Narrative:     Performed at:  [facility]  [address]  [place], [place] [address]   Phone [phone]   POCT GLUCOSE       All other labs were within normal range or not returned as of this dictation.    EKG: All EKG's are interpreted by the Emergency Department Physician who either signs orCo-signs this chart in the absence of a cardiologist.  Please see their note for interpretation of EKG.      RADIOLOGY:   Non-plain film images such as CT, Ultrasound and MRI are read by the radiologist. Plain radiographic images are visualized andpreliminarily interpreted by the  ED Provider with the below findings:        Interpretation perthe Radiologist below, if available at the time of this note:    CT HEAD WO CONTRAST   Final Result   1. No acute intracranial abnormality.   2. Moderate chronic white matter microvascular ischemic changes.   3. 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         XR CHEST PORTABLE   Final Result   Limited negative portable chest radiograph.           No results found.       PROCEDURES   Unless otherwise noted below, none      Procedures    CRITICAL CARE TIME   N/A    CONSULTS:  None      EMERGENCY DEPARTMENT COURSE and DIFFERENTIALDIAGNOSIS/MDM:   Vitals:    Vitals:    03/29/18 2131 03/29/18 2200 03/29/18 2305   BP:  (!) 224/100 (!) 179/137   Pulse: 98 101 97   Resp: 24 23 20    SpO2: 98%  97%       Patient was given thefollowing medications:  Medications   dextrose 50 % IV solution (25 g Intravenous Given 03/29/18 2137)       Patient presents for evaluation of altered mental status and hypoglycemia. On exam, she does seem altered, intoxicated but is in no acute distress and nontoxic. Not combative. She is oriented to person, place, time and situation. GCS 15. Cranial nerves II through XII are intact. Scalp is atraumatic and neck is nontender. Lungs are clear to auscultation bilaterally. Abdomen is benign. POC glucose 57, she was given amp of D50 and will be reevaluated. Please see attending note for EKG interpretation.    CBC and CMP are unremarkable aside from a knee failure. The creatinine of 2.0. Upon further questioning, patient reports that this is chronic. She knows about her kidney problems and has an appointment to follow up with nephrologist and she returns back home to [place]. Urinalysis is negative. Urine drug screen is negative. Acetaminophen, salicylate and ethanol levels are negative. Chest x-ray is negative. CT of the head shows no acute intracranial abnormality.     On reevaluation, patient was back at her baseline, acting appropriate. Family is present and able to contract for safety. She is tolerating p.o. without difficulty and blood glucose remained stable throughout her stay in the ED. She is requesting discharge home and I do believe this is appropriate, however, she was counseled on medication compliance and glucose monitoring.    Patient has  a normal repeat neurological exam. At this time there is no indication of head injury, encephalopathy, multiple sclerosis, TIA/CVA, seizures, dehydration, hypoglycemia, mass  lesions, metabolic cause, cardiac arrhythmia, PE, GI bleeding or orthostatic hypotension. Patient is stable throughout ED course and safe for outpatient follow-up. Patient made aware of treatment plan and verbally understood and agreed. Patient stable for outpatient follow-up with their family doctor in 24-48 hours.       FINAL IMPRESSION      1. Hypoglycemia    2. Chronic kidney disease, unspecified CKD stage          DISPOSITION/PLAN   DISPOSITION Decision To Discharge 03/29/2018 11:16:08 PM      PATIENT REFERREDTO:  [facility]  [address]  [place] [id number]  [phone]  Go to   If symptoms worsen      DISCHARGE MEDICATIONS:  New Prescriptions    No medications on file       DISCONTINUED MEDICATIONS:  Discontinued Medications    No medications on file              (Please note that portions ofthis note were completed with a voice recognition program.  Efforts were made to edit the dictations but occasionally words are mis-transcribed.)    Mikki HarborHolly E Lakeria Starkman, PA-C (electronically signed)            Sheliah MendsHolly Elizabeth Nashia Remus, PA-C  03/29/18 2320

## 2018-03-29 NOTE — ED Notes (Signed)
Bed: 01  Expected date:   Expected time:   Means of arrival:   Comments:  [facility] [address], RN  03/29/18 2128

## 2018-03-29 NOTE — ED Provider Notes (Signed)
I have personally performed a face to face diagnostic evaluation on this patient. I have fully participated in the care of this patient. I have reviewed and agree with all pertinent clinical informationincluding history, physical exam, diagnostic tests, and the plan.     History and Physical as follows:  HPI    Patient brought in with altered mental status is found be intact bilaterally symmetric with a blood sugar 36 improved with treatment she is awake alert and oriented this time denies complaints    Physical Exam    GENERAL: Patient appeared well-developed, well-nourished, vital signs reviewed.  MENTAL STATUS: Patient is awake and alert   HEAD AND FACE :Head is normalcephalic. Face normal appearance  EYES: eyes lids and conjunctiva appear normal  ENT :ears and nose appear normal externally.   CV: Heart regular rate and rhythm without murmur,  LUNGS: Lungs are clear to auscultation   MUSCULOSKELETAL: No joint deformities, normal muscle strength and tone.  PSYCHIATRIC:mood and affect normal    NEUROLOGIC: no weakness or numbness noted   This is a computerized dictation. I have made every effort to proofread this chart, however, transcription errors may exist.     The Ekg interpreted by me in the absence of a cardiologist shows.  Normal Sinus rhythm   Rate of   107  Axis is   Left axis deviation  QTc is  within an acceptable range  Intervals and Durations are unremarkable.      Nonspecific ST-T wave changes appreciated.  No evidence of acute ischemia.        Tomasa Hostelleronald J Macalister Arnaud, DO  03/30/18 [id number]

## 2018-03-30 ENCOUNTER — Inpatient Hospital Stay
Admit: 2018-03-30 | Discharge: 2018-03-30 | Disposition: A | Discharge status: Home / Self Care | Payer: MEDICARE | Attending: Emergency Medicine

## 2018-03-30 LAB — URINE DRUG SCREEN
Amphetamine Screen, Ur: NEGATIVE
Barbiturate Screen, Ur: NEGATIVE (ref <200)
Benzodiazepine Screen, Urine: NEGATIVE (ref <200)
Cannabinoid Scrn, Ur: NEGATIVE (ref <50)
Cocaine Metabolite Screen, Urine: NEGATIVE (ref <300)
Methadone Screen, Urine: NEGATIVE (ref <300)
Opiate Screen, Urine: NEGATIVE (ref <300)
Oxycodone Urine: NEGATIVE (ref <100)
Phencyclidine (PCP), Screen, Urine: NEGATIVE (ref <25)
Propoxyphene Scrn, Ur: NEGATIVE (ref <300)
pH, UA: 7

## 2018-03-30 LAB — URINALYSIS WITH REFLEX TO CULTURE
Bilirubin Urine: NEGATIVE
Glucose, Ur: 250 mg/dL — AB
Ketones, Urine: NEGATIVE mg/dL
Leukocyte Esterase, Urine: NEGATIVE
Nitrite, Urine: NEGATIVE
Protein, UA: >=300 mg/dL — AB
Specific Gravity, UA: 1.014 (ref 1.005–1.030)
Urobilinogen, Urine: 0.2 E.U./dL (ref <2.0)
pH, UA: 7 (ref 5.0–8.0)

## 2018-03-30 LAB — CBC WITH AUTO DIFFERENTIAL
Basophils %: 0.9 %
Basophils Absolute: 0.1 10*3/uL (ref 0.0–0.2)
Eosinophils %: 0.5 %
Eosinophils Absolute: 0 10*3/uL (ref 0.0–0.6)
Hematocrit: 39.2 % (ref 36.0–48.0)
Hemoglobin: 12.7 g/dL (ref 12.0–16.0)
Lymphocytes %: 13.9 %
Lymphocytes Absolute: 1.3 10*3/uL (ref 1.0–5.1)
MCH: 29.8 pg (ref 26.0–34.0)
MCHC: 32.4 g/dL (ref 31.0–36.0)
MCV: 92 fL (ref 80.0–100.0)
MPV: 9 fL (ref 5.0–10.5)
Monocytes %: 7.9 %
Monocytes Absolute: 0.7 10*3/uL (ref 0.0–1.3)
Neutrophils %: 76.8 %
Neutrophils Absolute: 7.2 10*3/uL (ref 1.7–7.7)
Platelets: 277 10*3/uL (ref 135–450)
RBC: 4.26 M/uL (ref 4.00–5.20)
RDW: 16.2 % — ABNORMAL HIGH (ref 12.4–15.4)
WBC: 9.4 10*3/uL (ref 4.0–11.0)

## 2018-03-30 LAB — COMPREHENSIVE METABOLIC PANEL
ALT: 25 U/L (ref 10–40)
AST: 42 U/L — ABNORMAL HIGH (ref 15–37)
Albumin/Globulin Ratio: 0.8 — ABNORMAL LOW (ref 1.1–2.2)
Albumin: 3.9 g/dL (ref 3.4–5.0)
Alkaline Phosphatase: 116 U/L (ref 40–129)
Anion Gap: 12 (ref 3–16)
BUN: 20 mg/dL (ref 7–20)
CO2: 26 mmol/L (ref 21–32)
Calcium: 9.9 mg/dL (ref 8.3–10.6)
Chloride: 103 mmol/L (ref 99–110)
Creatinine: 2 mg/dL — ABNORMAL HIGH (ref 0.6–1.2)
GFR African American: 31 — AB (ref >60)
GFR Non-African American: 25 — AB (ref >60)
Globulin: 4.6 g/dL
Glucose: 58 mg/dL — ABNORMAL LOW (ref 70–99)
Potassium: 4.3 mmol/L (ref 3.5–5.1)
Sodium: 141 mmol/L (ref 136–145)
Total Bilirubin: <0.2 mg/dL (ref 0.0–1.0)
Total Protein: 8.5 g/dL — ABNORMAL HIGH (ref 6.4–8.2)

## 2018-03-30 LAB — MICROSCOPIC URINALYSIS
Epithelial Cells, UA: 1 /HPF (ref 0–5)
Hyaline Casts, UA: 2 /LPF (ref 0–8)
RBC, UA: 1 /HPF (ref 0–4)
WBC, UA: 1 /HPF (ref 0–5)

## 2018-03-30 LAB — EKG 12-LEAD
Atrial Rate: 107 {beats}/min
Atrial Rate: 86 {beats}/min
P Axis: 54 degrees
P Axis: 63 degrees
P-R Interval: 188 ms
P-R Interval: 198 ms
Q-T Interval: 342 ms
Q-T Interval: 372 ms
QRS Duration: 76 ms
QRS Duration: 88 ms
QTc Calculation (Bazett): 445 ms
QTc Calculation (Bazett): 456 ms
R Axis: -14 degrees
R Axis: -22 degrees
T Axis: 57 degrees
T Axis: 62 degrees
Ventricular Rate: 107 {beats}/min
Ventricular Rate: 86 {beats}/min

## 2018-03-30 LAB — POCT GLUCOSE
POC Glucose: 208 mg/dl — ABNORMAL HIGH (ref 70–99)
POC Glucose: 57 mg/dl — ABNORMAL LOW (ref 70–99)
POC Glucose: 142 mg/dl — ABNORMAL HIGH (ref 70–99)

## 2018-03-30 LAB — ETHANOL: Ethanol Lvl: NOT DETECTED mg/dL

## 2018-03-30 LAB — ACETAMINOPHEN LEVEL: Acetaminophen Level: <5 ug/mL — ABNORMAL LOW (ref 10–30)

## 2018-03-30 LAB — TROPONIN: Troponin: <0.01 ng/mL (ref <0.01)

## 2018-03-30 LAB — LIPASE: Lipase: 11 U/L — ABNORMAL LOW (ref 13.0–60.0)

## 2018-03-30 LAB — SALICYLATE LEVEL: Salicylate Lvl: <0.3 mg/dL — ABNORMAL LOW (ref 15.0–30.0)

## 2018-03-30 MED ORDER — DEXTROSE 50 % IV SOLN
50 % | INTRAVENOUS | Status: DC | PRN
Start: 2018-03-30 — End: 2018-03-30
  Administered 2018-03-30: 02:00:00 25 g via INTRAVENOUS

## 2022-12-21 DEATH — deceased
